# Patient Record
Sex: Male | Born: 1960 | Race: Black or African American | Hispanic: No | Marital: Single | Smoking: Current every day smoker
Health system: Southern US, Community
[De-identification: ages and names within clinical notes are randomized; demographics above are authoritative.]

## PROBLEM LIST (undated history)

## (undated) ENCOUNTER — Ambulatory Visit (HOSPITAL_COMMUNITY): Source: Home / Self Care

## (undated) ENCOUNTER — Emergency Department (HOSPITAL_COMMUNITY): Discharge status: Absent without leave | Payer: PRIVATE HEALTH INSURANCE

## (undated) DIAGNOSIS — I10 Essential (primary) hypertension: Secondary | ICD-10-CM

---

## 1999-05-03 ENCOUNTER — Emergency Department (HOSPITAL_COMMUNITY): Admission: EM | Admit: 1999-05-03 | Discharge: 1999-05-03 | Payer: Self-pay | Admitting: Internal Medicine

## 1999-05-15 ENCOUNTER — Emergency Department (HOSPITAL_COMMUNITY): Admission: EM | Admit: 1999-05-15 | Discharge: 1999-05-15 | Payer: Self-pay | Admitting: Emergency Medicine

## 2002-06-08 ENCOUNTER — Encounter: Admission: RE | Admit: 2002-06-08 | Discharge: 2002-06-08 | Payer: Self-pay | Admitting: Internal Medicine

## 2003-02-25 ENCOUNTER — Emergency Department (HOSPITAL_COMMUNITY): Admission: AD | Admit: 2003-02-25 | Discharge: 2003-02-25 | Payer: Self-pay | Admitting: Emergency Medicine

## 2003-02-25 ENCOUNTER — Encounter: Payer: Self-pay | Admitting: Emergency Medicine

## 2004-07-30 ENCOUNTER — Emergency Department (HOSPITAL_COMMUNITY): Admission: EM | Admit: 2004-07-30 | Discharge: 2004-07-30 | Payer: Self-pay | Admitting: Emergency Medicine

## 2004-08-02 ENCOUNTER — Encounter (HOSPITAL_COMMUNITY): Admission: RE | Admit: 2004-08-02 | Discharge: 2004-10-31 | Payer: Self-pay | Admitting: Emergency Medicine

## 2004-08-06 ENCOUNTER — Emergency Department (HOSPITAL_COMMUNITY): Admission: EM | Admit: 2004-08-06 | Discharge: 2004-08-06 | Payer: Self-pay | Admitting: Emergency Medicine

## 2004-08-13 ENCOUNTER — Emergency Department (HOSPITAL_COMMUNITY): Admission: EM | Admit: 2004-08-13 | Discharge: 2004-08-13 | Payer: Self-pay | Admitting: Emergency Medicine

## 2004-08-27 ENCOUNTER — Emergency Department (HOSPITAL_COMMUNITY): Admission: EM | Admit: 2004-08-27 | Discharge: 2004-08-27 | Payer: Self-pay | Admitting: Emergency Medicine

## 2007-11-09 ENCOUNTER — Emergency Department (HOSPITAL_COMMUNITY): Admission: EM | Admit: 2007-11-09 | Discharge: 2007-11-09 | Payer: Self-pay | Admitting: Emergency Medicine

## 2007-11-17 ENCOUNTER — Emergency Department (HOSPITAL_COMMUNITY): Admission: EM | Admit: 2007-11-17 | Discharge: 2007-11-17 | Payer: Self-pay | Admitting: *Deleted

## 2008-10-21 ENCOUNTER — Emergency Department (HOSPITAL_COMMUNITY): Admission: EM | Admit: 2008-10-21 | Discharge: 2008-10-21 | Payer: Self-pay | Admitting: Emergency Medicine

## 2008-12-21 ENCOUNTER — Emergency Department (HOSPITAL_COMMUNITY): Admission: EM | Admit: 2008-12-21 | Discharge: 2008-12-21 | Payer: Self-pay | Admitting: Family Medicine

## 2009-01-07 ENCOUNTER — Emergency Department (HOSPITAL_COMMUNITY): Admission: EM | Admit: 2009-01-07 | Discharge: 2009-01-07 | Payer: Self-pay | Admitting: Emergency Medicine

## 2011-08-24 LAB — DIFFERENTIAL
Basophils Absolute: 0.1
Eosinophils Absolute: 0.2
Eosinophils Relative: 2
Lymphocytes Relative: 5 — ABNORMAL LOW
Lymphs Abs: 0.5 — ABNORMAL LOW
Neutrophils Relative %: 89 — ABNORMAL HIGH

## 2011-08-24 LAB — CBC
MCV: 87.8
RBC: 5.75
WBC: 9.9

## 2011-08-24 LAB — COMPREHENSIVE METABOLIC PANEL
ALT: 68 — ABNORMAL HIGH
AST: 46 — ABNORMAL HIGH
Alkaline Phosphatase: 122 — ABNORMAL HIGH
CO2: 24
Chloride: 104
Creatinine, Ser: 1.22
GFR calc Af Amer: >60
GFR calc non Af Amer: >60
Potassium: 4.4
Total Bilirubin: 1.6 — ABNORMAL HIGH

## 2011-08-24 LAB — ETHANOL: Alcohol, Ethyl (B): <5

## 2011-08-24 LAB — LIPASE, BLOOD: Lipase: 19

## 2012-12-09 ENCOUNTER — Emergency Department (HOSPITAL_COMMUNITY): Payer: PRIVATE HEALTH INSURANCE

## 2012-12-09 ENCOUNTER — Emergency Department (HOSPITAL_COMMUNITY)
Admission: EM | Admit: 2012-12-09 | Discharge: 2012-12-09 | Disposition: A | Discharge status: Home / Self Care | Payer: PRIVATE HEALTH INSURANCE | Attending: Emergency Medicine | Admitting: Emergency Medicine

## 2012-12-09 DIAGNOSIS — N390 Urinary tract infection, site not specified: Secondary | ICD-10-CM | POA: Insufficient documentation

## 2012-12-09 DIAGNOSIS — A5903 Trichomonal cystitis and urethritis: Secondary | ICD-10-CM | POA: Insufficient documentation

## 2012-12-09 DIAGNOSIS — J029 Acute pharyngitis, unspecified: Secondary | ICD-10-CM | POA: Insufficient documentation

## 2012-12-09 DIAGNOSIS — J3489 Other specified disorders of nose and nasal sinuses: Secondary | ICD-10-CM | POA: Insufficient documentation

## 2012-12-09 DIAGNOSIS — R259 Unspecified abnormal involuntary movements: Secondary | ICD-10-CM | POA: Insufficient documentation

## 2012-12-09 DIAGNOSIS — Z7982 Long term (current) use of aspirin: Secondary | ICD-10-CM | POA: Insufficient documentation

## 2012-12-09 DIAGNOSIS — R000 Tachycardia, unspecified: Secondary | ICD-10-CM | POA: Insufficient documentation

## 2012-12-09 DIAGNOSIS — R059 Cough, unspecified: Secondary | ICD-10-CM | POA: Insufficient documentation

## 2012-12-09 DIAGNOSIS — R05 Cough: Secondary | ICD-10-CM | POA: Insufficient documentation

## 2012-12-09 LAB — URINALYSIS, ROUTINE W REFLEX MICROSCOPIC
Glucose, UA: NEGATIVE mg/dL
Hgb urine dipstick: NEGATIVE
Ketones, ur: NEGATIVE mg/dL
pH: 5 (ref 5.0–8.0)

## 2012-12-09 LAB — COMPREHENSIVE METABOLIC PANEL
ALT: 24 U/L (ref 0–53)
AST: 18 U/L (ref 0–37)
Albumin: 3.6 g/dL (ref 3.5–5.2)
Alkaline Phosphatase: 128 U/L — ABNORMAL HIGH (ref 39–117)
CO2: 24 mEq/L (ref 19–32)
Chloride: 100 mEq/L (ref 96–112)
GFR calc non Af Amer: 70 mL/min — ABNORMAL LOW (ref >90)
Potassium: 3.8 mEq/L (ref 3.5–5.1)
Sodium: 136 mEq/L (ref 135–145)
Total Bilirubin: 1 mg/dL (ref 0.3–1.2)

## 2012-12-09 LAB — CBC WITH DIFFERENTIAL/PLATELET
Basophils Absolute: 0 10*3/uL (ref 0.0–0.1)
Basophils Relative: 0 % (ref 0–1)
Lymphocytes Relative: 5 % — ABNORMAL LOW (ref 12–46)
MCHC: 35.2 g/dL (ref 30.0–36.0)
Neutro Abs: 9.3 10*3/uL — ABNORMAL HIGH (ref 1.7–7.7)
Neutrophils Relative %: 91 % — ABNORMAL HIGH (ref 43–77)
RDW: 12.1 % (ref 11.5–15.5)
WBC: 10.2 10*3/uL (ref 4.0–10.5)

## 2012-12-09 LAB — URINE MICROSCOPIC-ADD ON

## 2012-12-09 MED ORDER — ONDANSETRON HCL 4 MG/2ML IJ SOLN
4.0000 mg | Freq: Once | INTRAMUSCULAR | Status: AC
  Administered 2012-12-09: 4 mg via INTRAVENOUS
  Filled 2012-12-09: qty 2

## 2012-12-09 MED ORDER — ACETAMINOPHEN 325 MG PO TABS
650.0000 mg | ORAL_TABLET | Freq: Once | ORAL | Status: AC
  Administered 2012-12-09: 650 mg via ORAL
  Filled 2012-12-09: qty 2

## 2012-12-09 MED ORDER — IBUPROFEN 800 MG PO TABS
800.0000 mg | ORAL_TABLET | Freq: Once | ORAL | Status: AC
  Administered 2012-12-09: 800 mg via ORAL
  Filled 2012-12-09: qty 1

## 2012-12-09 MED ORDER — CEFTRIAXONE SODIUM 1 G IJ SOLR
1.0000 g | Freq: Once | INTRAMUSCULAR | Status: AC
  Administered 2012-12-09: 1 g via INTRAMUSCULAR
  Filled 2012-12-09: qty 10

## 2012-12-09 MED ORDER — CEPHALEXIN 500 MG PO CAPS: 1000.0000 mg | ORAL_CAPSULE | Freq: Two times a day (BID) | ORAL | Status: DC

## 2012-12-09 MED ORDER — METRONIDAZOLE 500 MG PO TABS
2000.0000 mg | ORAL_TABLET | Freq: Once | ORAL | Status: AC
  Administered 2012-12-09: 2000 mg via ORAL
  Filled 2012-12-09: qty 4

## 2012-12-09 NOTE — ED Notes (Signed)
Informed Catherine of temp and diaphoresis. Walked pt to restroom for urine sample. Pt ambulated without difficulty.

## 2012-12-09 NOTE — ED Notes (Addendum)
Pt has had a sore throat for a wk and tonight at work he starting shaking. States he has had a productive cough, sneezing, denies n/v/d. HR 150. BG 121

## 2012-12-09 NOTE — ED Notes (Signed)
ZOX:[id number]<AV> Expected date:<BR> Expected time:<BR> Means of arrival:<BR> Comments:<BR> EMS/52 yo male from work/chills/fever-ST 150-12 lead unremarkable

## 2012-12-09 NOTE — ED Provider Notes (Signed)
History     CSN: [id number]  Arrival date & time 12/09/12  0118   None     Chief Complaint  Patient presents with  . Tachycardia  . Sore Throat    (Consider location/radiation/quality/duration/timing/severity/associated sxs/prior treatment) HPI History provided by pt.   Pt reports that he developed severe chills at noon while at work today.  Associated w/ sneezing.  No recent nasal congestion, rhinorrhea, sore throat, cough, CP, SOB, abd pain, N/V/D, urinary sx or rash.  Had tylenol when he arrived in ED and sx have improved.  No known sick contacts.  No PMH.  No past medical history on file.  No past surgical history on file.  No family history on file.  History  Substance Use Topics  . Smoking status: Not on file  . Smokeless tobacco: Not on file  . Alcohol Use: Not on file      Review of Systems  All other systems reviewed and are negative.    Allergies  Review of patient's allergies indicates no known allergies.  Home Medications  No current outpatient prescriptions on file.  BP 129/74  Pulse 134  Temp 103 F (39.4 C) (Oral)  Resp 22  SpO2 94%  Physical Exam  Nursing note and vitals reviewed. Constitutional: He is oriented to person, place, and time. He appears well-developed and well-nourished. No distress.  HENT:  Head: Normocephalic and atraumatic.  Eyes:       Normal appearance  Neck: Normal range of motion.  Cardiovascular: Regular rhythm.        Tachycardic at 112bpm  Pulmonary/Chest: Effort normal and breath sounds normal. No respiratory distress.  Abdominal: Soft. Bowel sounds are normal. He exhibits no distension and no mass. There is no tenderness. There is no rebound and no guarding.  Genitourinary:       No CVA tenderness  Musculoskeletal: Normal range of motion.       No LE edema/ttp  Neurological: He is alert and oriented to person, place, and time.  Skin: Skin is warm and dry. No rash noted.  Psychiatric: He has a normal mood and  affect. His behavior is normal.    ED Course  Procedures (including critical care time)   Labs Reviewed  CBC WITH DIFFERENTIAL  COMPREHENSIVE METABOLIC PANEL  TROPONIN I   No results found.   1. Urinary tract infection   2. Trichomonal urethritis       MDM  52yo healthy M presents w/ fever and chills.  Febrile, non-toxic appearance, nml breath sounds, abd benign on exam.  Temp improved and pt reported feeling better after receiving tylenol, but now up to 100.2.  Ibuprofen 800mg  ordered.  CXR neg and U/A pending.  3:45 AM   CXR negative for acute pathology.  U/A positive for infection, including trich.  Results discussed w/ pt.  He is tolerating pos and is currently afebrile and with nml VS.  Received 1g IM rocephin + 2g pos flagl and d/c'd home w/ keflex.  Return precautions discussed.         Arie Sabina Dexter Sauser, PA-C 12/09/12 [phone]

## 2012-12-09 NOTE — ED Notes (Signed)
Voiced understanding of instructions given 

## 2012-12-11 LAB — URINE CULTURE

## 2012-12-14 NOTE — ED Provider Notes (Signed)
Medical screening examination/treatment/procedure(s) were performed by non-physician practitioner and as supervising physician I was immediately available for consultation/collaboration.  Tobin Chad, MD 12/14/12 [phone]

## 2014-01-30 ENCOUNTER — Emergency Department (HOSPITAL_COMMUNITY)
Admission: EM | Admit: 2014-01-30 | Discharge: 2014-01-30 | Disposition: A | Discharge status: Home / Self Care | Payer: Non-veteran care | Attending: Emergency Medicine | Admitting: Emergency Medicine

## 2014-01-30 ENCOUNTER — Encounter (HOSPITAL_COMMUNITY): Payer: Self-pay | Admitting: Emergency Medicine

## 2014-01-30 ENCOUNTER — Emergency Department (HOSPITAL_COMMUNITY): Payer: Non-veteran care

## 2014-01-30 DIAGNOSIS — M25469 Effusion, unspecified knee: Secondary | ICD-10-CM | POA: Diagnosis not present

## 2014-01-30 DIAGNOSIS — Z792 Long term (current) use of antibiotics: Secondary | ICD-10-CM | POA: Insufficient documentation

## 2014-01-30 DIAGNOSIS — M25569 Pain in unspecified knee: Secondary | ICD-10-CM | POA: Diagnosis present

## 2014-01-30 DIAGNOSIS — M25461 Effusion, right knee: Secondary | ICD-10-CM

## 2014-01-30 DIAGNOSIS — Z79899 Other long term (current) drug therapy: Secondary | ICD-10-CM | POA: Insufficient documentation

## 2014-01-30 DIAGNOSIS — Z7982 Long term (current) use of aspirin: Secondary | ICD-10-CM | POA: Diagnosis not present

## 2014-01-30 LAB — CBG MONITORING, ED: GLUCOSE-CAPILLARY: 101 mg/dL — AB (ref 70–99)

## 2014-01-30 LAB — GRAM STAIN

## 2014-01-30 LAB — SYNOVIAL CELL COUNT + DIFF, W/ CRYSTALS
CRYSTALS FLUID: NONE SEEN
EOSINOPHILS-SYNOVIAL: 0 % (ref 0–1)
LYMPHOCYTES-SYNOVIAL FLD: 30 % — AB (ref 0–20)
MONOCYTE-MACROPHAGE-SYNOVIAL FLUID: 56 % (ref 50–90)
Neutrophil, Synovial: 14 % (ref 0–25)
WBC, Synovial: 525 /mm3 — ABNORMAL HIGH (ref 0–200)

## 2014-01-30 MED ORDER — NAPROXEN 500 MG PO TABS: 500.0000 mg | ORAL_TABLET | Freq: Two times a day (BID) | ORAL | Status: DC

## 2014-01-30 NOTE — Discharge Instructions (Signed)
Naproxen and naproxen sodium oral immediate-release tablets What is this medicine? NAPROXEN (na PROX en) is a non-steroidal anti-inflammatory drug (NSAID). It is used to reduce swelling and to treat pain. This medicine may be used for dental pain, headache, or painful monthly periods. It is also used for painful joint and muscular problems such as arthritis, tendinitis, bursitis, and gout. This medicine may be used for other purposes; ask your health care provider or pharmacist if you have questions. COMMON BRAND NAME(S): Aflaxen, Aleve Arthritis, Aleve, All Day Relief, Anaprox DS, Anaprox, Naprosyn What should I tell my health care provider before I take this medicine? They need to know if you have any of these conditions: -asthma -cigarette smoker -drink more than 3 alcohol containing drinks a day -heart disease or circulation problems such as heart failure or leg edema (fluid retention) -high blood pressure -kidney disease -liver disease -stomach bleeding or ulcers -an unusual or allergic reaction to naproxen, aspirin, other NSAIDs, other medicines, foods, dyes, or preservatives -pregnant or trying to get pregnant -breast-feeding How should I use this medicine? Take this medicine by mouth with a glass of water. Follow the directions on the prescription label. Take it with food if your stomach gets upset. Try to not lie down for at least 10 minutes after you take it. Take your medicine at regular intervals. Do not take your medicine more often than directed. Long-term, continuous use may increase the risk of heart attack or stroke. A special MedGuide will be given to you by the pharmacist with each prescription and refill. Be sure to read this information carefully each time. Talk to your pediatrician regarding the use of this medicine in children. Special care may be needed. Overdosage: If you think you have taken too much of this medicine contact a poison control center or emergency room at  once. NOTE: This medicine is only for you. Do not share this medicine with others. What if I miss a dose? If you miss a dose, take it as soon as you can. If it is almost time for your next dose, take only that dose. Do not take double or extra doses. What may interact with this medicine? -alcohol -aspirin -cidofovir -diuretics -lithium -methotrexate -other drugs for inflammation like ketorolac or prednisone -pemetrexed -probenecid -warfarin This list may not describe all possible interactions. Give your health care provider a list of all the medicines, herbs, non-prescription drugs, or dietary supplements you use. Also tell them if you smoke, drink alcohol, or use illegal drugs. Some items may interact with your medicine. What should I watch for while using this medicine? Tell your doctor or health care professional if your pain does not get better. Talk to your doctor before taking another medicine for pain. Do not treat yourself. This medicine does not prevent heart attack or stroke. In fact, this medicine may increase the chance of a heart attack or stroke. The chance may increase with longer use of this medicine and in people who have heart disease. If you take aspirin to prevent heart attack or stroke, talk with your doctor or health care professional. Do not take other medicines that contain aspirin, ibuprofen, or naproxen with this medicine. Side effects such as stomach upset, nausea, or ulcers may be more likely to occur. Many medicines available without a prescription should not be taken with this medicine. This medicine can cause ulcers and bleeding in the stomach and intestines at any time during treatment. Do not smoke cigarettes or drink alcohol. These increase  irritation to your stomach and can make it more susceptible to damage from this medicine. Ulcers and bleeding can happen without warning symptoms and can cause death. You may get drowsy or dizzy. Do not drive, use machinery,  or do anything that needs mental alertness until you know how this medicine affects you. Do not stand or sit up quickly, especially if you are an older patient. This reduces the risk of dizzy or fainting spells. This medicine can cause you to bleed more easily. Try to avoid damage to your teeth and gums when you brush or floss your teeth. What side effects may I notice from receiving this medicine? Side effects that you should report to your doctor or health care professional as soon as possible: -black or bloody stools, blood in the urine or vomit -blurred vision -chest pain -difficulty breathing or wheezing -nausea or vomiting -severe stomach pain -skin rash, skin redness, blistering or peeling skin, hives, or itching -slurred speech or weakness on one side of the body -swelling of eyelids, throat, lips -unexplained weight gain or swelling -unusually weak or tired -yellowing of eyes or skin Side effects that usually do not require medical attention (report to your doctor or health care professional if they continue or are bothersome): -constipation -headache -heartburn This list may not describe all possible side effects. Call your doctor for medical advice about side effects. You may report side effects to FDA at 1-800-FDA-1088. Where should I keep my medicine? Keep out of the reach of children. Store at room temperature between 15 and 30 degrees C (59 and 86 degrees F). Keep container tightly closed. Throw away any unused medicine after the expiration date. NOTE: This sheet is a summary. It may not cover all possible information. If you have questions about this medicine, talk to your doctor, pharmacist, or health care provider.  2014, Elsevier/Gold Standard. (2009-11-07 20:10:16) Knee Effusion The medical term for having fluid in your knee is effusion. This is often due to an internal derangement of the knee. This means something is wrong inside the knee. Some of the causes of fluid  in the knee may be torn cartilage, a torn ligament, or bleeding into the joint from an injury. Your knee is likely more difficult to bend and move. This is often because there is increased pain and pressure in the joint. The time it takes for recovery from a knee effusion depends on different factors, including:   Type of injury.  Your age.  Physical and medical conditions.  Rehabilitation Strategies. How long you will be away from your normal activities will depend on what kind of knee problem you have and how much damage is present. Your knee has two types of cartilage. Articular cartilage covers the bone ends and lets your knee bend and move smoothly. Two menisci, thick pads of cartilage that form a rim inside the joint, help absorb shock and stabilize your knee. Ligaments bind the bones together and support your knee joint. Muscles move the joint, help support your knee, and take stress off the joint itself. CAUSES  Often an effusion in the knee is caused by an injury to one of the menisci. This is often a tear in the cartilage. Recovery after a meniscus injury depends on how much meniscus is damaged and whether you have damaged other knee tissue. Small tears may heal on their own with conservative treatment. Conservative means rest, limited weight bearing activity and muscle strengthening exercises. Your recovery may take up to 6 weeks.  TREATMENT  Larger tears may require surgery. Meniscus injuries may be treated during arthroscopy. Arthroscopy is a procedure in which your surgeon uses a small telescope like instrument to look in your knee. Your caregiver can make a more accurate diagnosis (learning what is wrong) by performing an arthroscopic procedure. If your injury is on the inner margin of the meniscus, your surgeon may trim the meniscus back to a smooth rim. In other cases your surgeon will try to repair a damaged meniscus with stitches (sutures). This may make rehabilitation take longer,  but may provide better long term result by helping your knee keep its shock absorption capabilities. Ligaments which are completely torn usually require surgery for repair. HOME CARE INSTRUCTIONS  Use crutches as instructed.  If a brace is applied, use as directed.  Once you are home, an ice pack applied to your swollen knee may help with discomfort and help decrease swelling.  Keep your knee raised (elevated) when you are not up and around or on crutches.  Only take over-the-counter or prescription medicines for pain, discomfort, or fever as directed by your caregiver.  Your caregivers will help with instructions for rehabilitation of your knee. This often includes strengthening exercises.  You may resume a normal diet and activities as directed. SEEK MEDICAL CARE IF:   There is increased swelling in your knee.  You notice redness, swelling, or increasing pain in your knee.  An unexplained oral temperature above 102 F (38.9 C) develops. SEEK IMMEDIATE MEDICAL CARE IF:   You develop a rash.  You have difficulty breathing.  You have any allergic reactions from medications you may have been given.  There is severe pain with any motion of the knee. MAKE SURE YOU:   Understand these instructions.  Will watch your condition.  Will get help right away if you are not doing well or get worse. Document Released: 01/26/2004 Document Revised: 01/28/2012 Document Reviewed: 03/31/2008 [facility] Patient Information 2014 [place], [place].

## 2014-01-30 NOTE — ED Notes (Signed)
Pt from home c/o right knee pain and swelling in which he woke up with about 1 week ago. Denies injury of any sort. Family Hx of gout.

## 2014-01-30 NOTE — ED Provider Notes (Signed)
CSN: [id number]     Arrival date & time 01/30/14  1247 History   First MD Initiated Contact with Patient 01/30/14 1312     This chart was scribed for non-physician practitioner, Arthor Captain, PA-C, working with Gavin Pound. Oletta Lamas, MD by Arlan Organ, ED Scribe. This patient was seen in room WTR6/WTR6 and the patient's care was started at 1:13 PM.   Chief Complaint  Patient presents with  . Knee Pain   HPI  HPI Comments: Derrick Foster is a 53 y.o. male who presents to the Emergency Department complaining of right knee pain x 6 days that is progressively worsening. Pt also reports associated swelling to the knee along with "popping", feeling unstable, and "clicking". Denies any known injury or trauma, however, he associates this with job related activities . States he is able to ambulate, but with difficulty secondary to pain. He admits to a family history of gout, but denies a personal history. At this time he denies any fever or chills. Pt has no other pertinent medical history, and no other concerns this visit.    No past medical history on file. No past surgical history on file. No family history on file. History  Substance Use Topics  . Smoking status: Not on file  . Smokeless tobacco: Not on file  . Alcohol Use: Not on file    Review of Systems  Constitutional: Negative for fever and chills.  Eyes: Negative for redness.  Respiratory: Negative for cough.   Musculoskeletal: Positive for arthralgias and joint swelling.  Skin: Negative for rash.  Psychiatric/Behavioral: Negative for confusion.      Allergies  Review of patient's allergies indicates no known allergies.  Home Medications   Current Outpatient Rx  Name  Route  Sig  Dispense  Refill  . aspirin EC 81 MG tablet   Oral   Take 81 mg by mouth daily.         . cephALEXin (KEFLEX) 500 MG capsule   Oral   Take 2 capsules (1,000 mg total) by mouth 2 (two) times daily.   28 capsule   0   . Multiple Vitamin  (MULTIVITAMIN WITH MINERALS) TABS   Oral   Take 1 tablet by mouth daily.         Marland Kitchen Phenyleph-Doxylamine-DM-APAP (ALKA-SELTZER PLS NIGHT CLD/FLU PO)   Oral   Take 1 packet by mouth 2 (two) times daily as needed. For cold & flu symptoms         . Probiotic Product (PROBIOTIC DAILY PO)   Oral   Take 1 capsule by mouth daily.          . Pseudoeph-Doxylamine-DM-APAP (NYQUIL D COLD/FLU PO)   Oral   Take 15 mLs by mouth 2 (two) times daily as needed. For cold & flu symptoms          Triage Vitals: BP 153/88  Pulse 76  Temp(Src) 98.2 F (36.8 C) (Oral)  Resp 18  Ht 6' (1.829 m)  Wt 260 lb (117.935 kg)  BMI 35.25 kg/m2  SpO2 99%   Physical Exam  Nursing note and vitals reviewed. Constitutional: He is oriented to person, place, and time. He appears well-developed and well-nourished.  HENT:  Head: Normocephalic and atraumatic.  Eyes: EOM are normal.  Neck: Normal range of motion.  Cardiovascular: Normal rate.   Pulmonary/Chest: Effort normal.  Musculoskeletal: Normal range of motion. He exhibits edema and tenderness.  RIGHT KNEE: Palpable effusion in the knee suprapatellar tenderness  No erythema  Swelling noted No warmth No crepitus with movement of knee ROM is limited due to swelling and tenderness  Neurological: He is alert and oriented to person, place, and time.  Skin: Skin is warm and dry.  Psychiatric: He has a normal mood and affect. His behavior is normal.    ED Course  Procedures (including critical care time)  DIAGNOSTIC STUDIES: Oxygen Saturation is 99% on RA, Normal by my interpretation.    COORDINATION OF CARE: 1:13 PM- Will order knee complete 4 view X-Ray. Will perform knee arthrocentesis procedure. Discussed treatment plan with pt at bedside and pt agreed to plan.    2:48 PM-  Knee Arthrocentesis Procedure Note Date:09/06/2011  At 2:49 PM Indication: Suprapatellar Effusion Procedure performed by: Arthor CaptainAbigail Darling Cieslewicz, PA-C Site: Right  knee Technique: Supralateral Sterile procedures observed Time-out performed prior to start of procedure and correct patient, site and procedure verified.  Anesthetic used (type and amount): lidocaine without 1%  Fluid aspirated (amount): 40 ml Appearance of Fluid: Yellow  Patient's knee was placed into an appropriate position prior to introduction of needle. Patient tolerated procedure well with no complications. Blood loss was minimal.    Labs Review Labs Reviewed - No data to display Imaging Review No results found.   EKG Interpretation None      MDM   Final diagnoses:  Knee effusion, right    Patient with arthrocentesis. Fluid sent for analysis. No organisms and negative gram stain. Ace wrap and follow up with orhto. No concern for gout or septic joint   I personally performed the services described in this documentation, which was scribed in my presence. The recorded information has been reviewed and is accurate.       Arthor Captainbigail Daylani Deblois, PA-C 01/30/14 1936

## 2014-01-31 LAB — GLUCOSE, SYNOVIAL FLUID: GLUCOSE, SYNOVIAL FLUID: 101 mg/dL

## 2014-01-31 NOTE — ED Provider Notes (Signed)
Medical screening examination/treatment/procedure(s) were performed by non-physician practitioner and as supervising physician I was immediately available for consultation/collaboration.   EKG Interpretation None        Oney Tatlock Y. Fatoumata Albaugh, MD 01/31/14 0815 

## 2014-02-03 LAB — BODY FLUID CULTURE: Culture: NO GROWTH

## 2015-03-10 IMAGING — CR DG KNEE COMPLETE 4+V*R*
4 series · 4 of 4 positions shown · non-contrast
Comparison: None.

CLINICAL DATA: Right knee pain and swelling

EXAM:
RIGHT KNEE - COMPLETE 4+ VIEW

[t knee ap right]
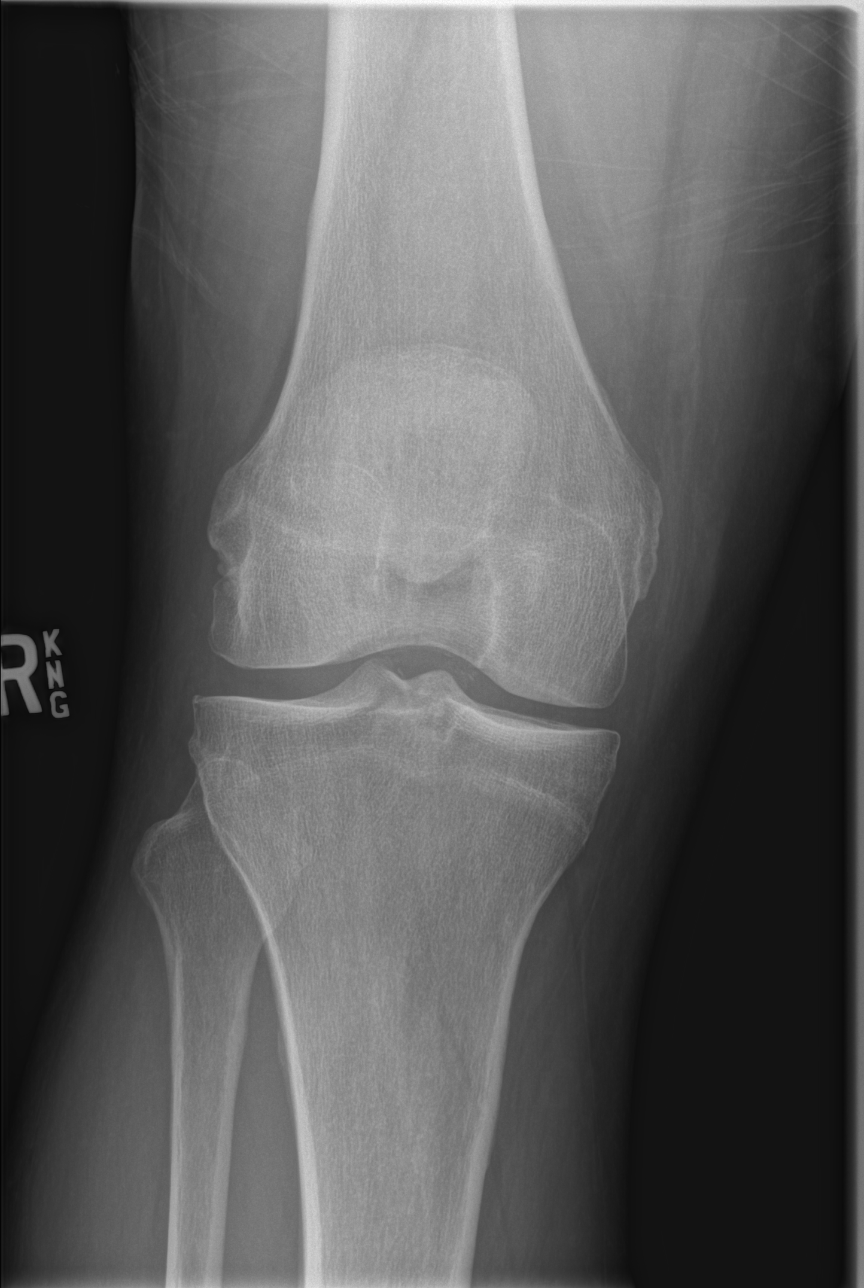

[t knee obl right (1 of 2)]
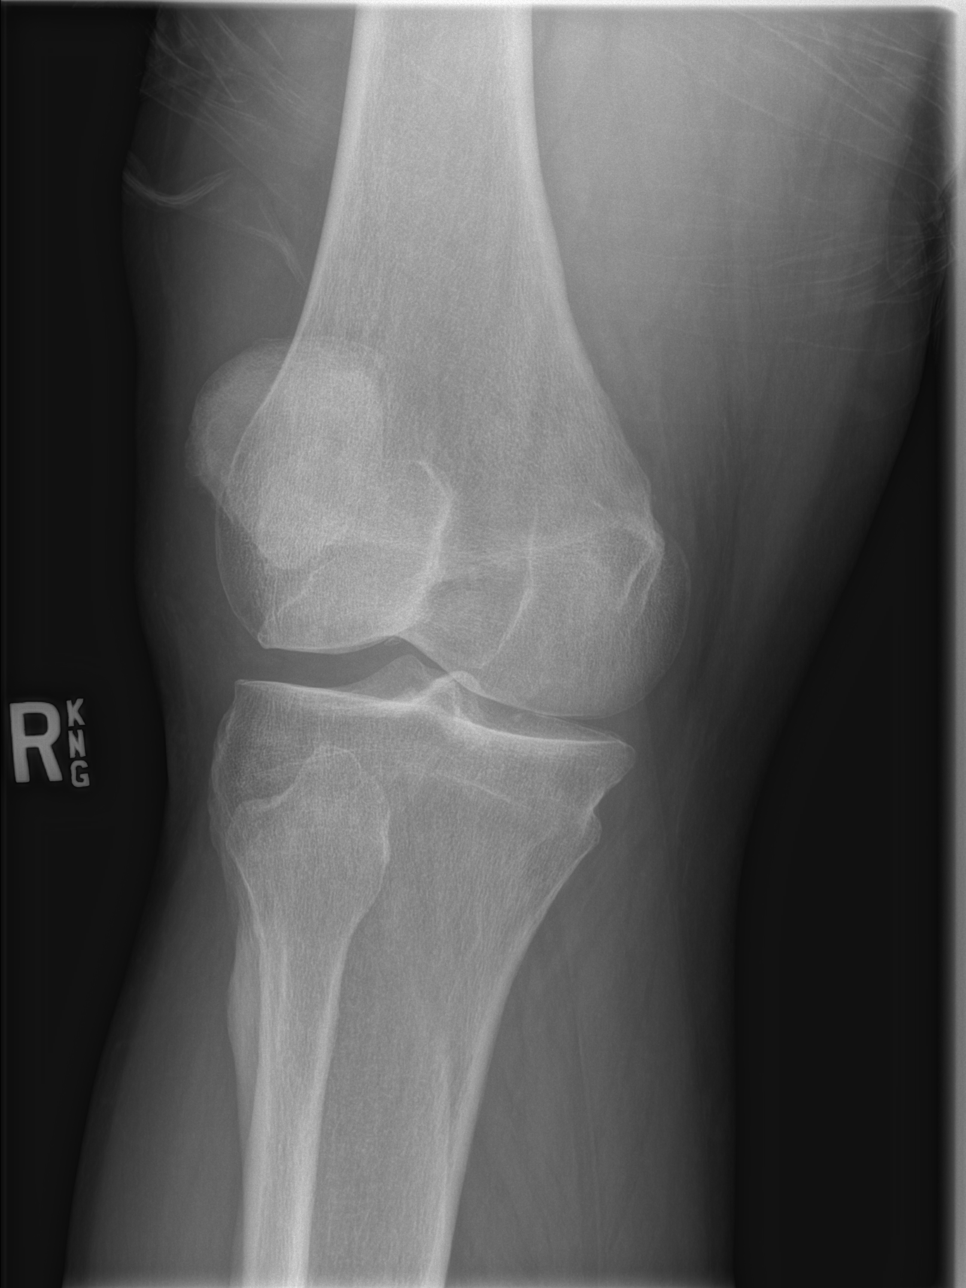

[t knee obl right (2 of 2)]
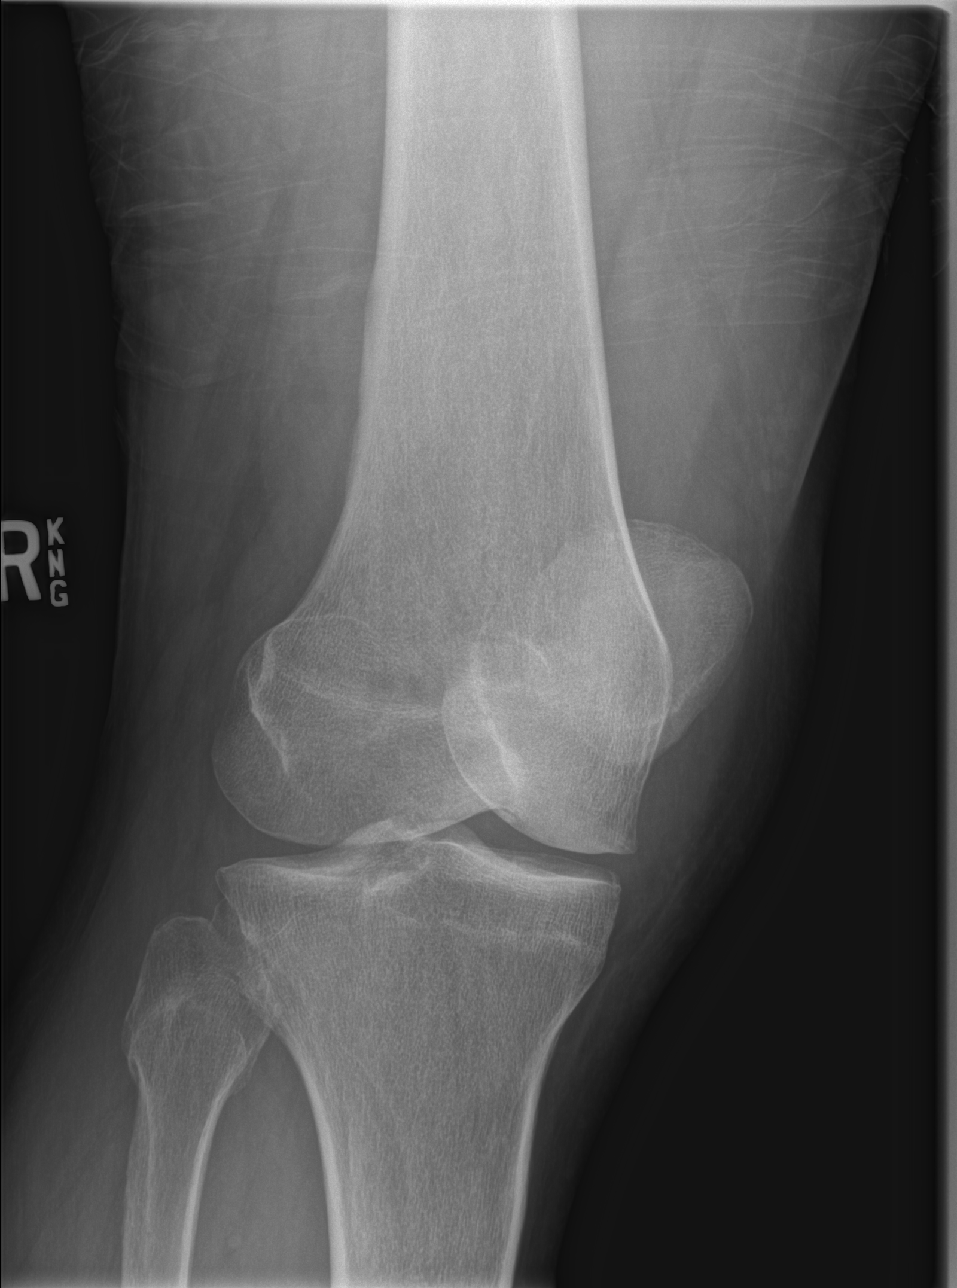

[t knee lat right]
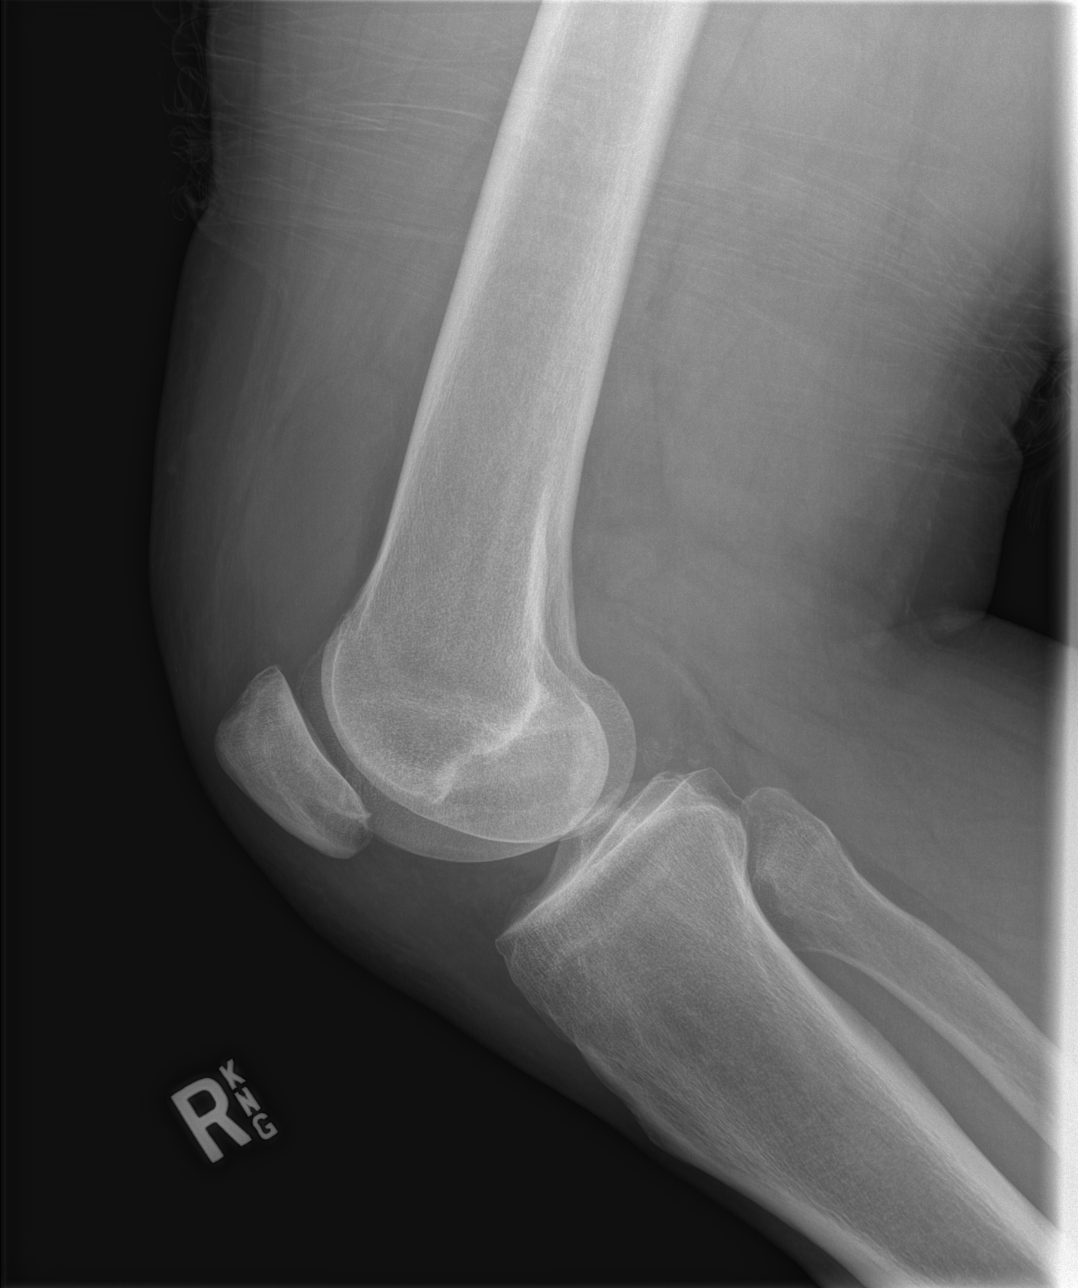

[4 of 4 positions shown; findings below may reference images not displayed]

FINDINGS: No acute fracture or malalignment. There is a moderate suprapatellar
joint effusion. Faint calcification noted in the posterior and
central joint on both the frontal and lateral view. This may
represent a partially calcified intra-articular loose body, or
partially calcified synovial hyperplasia. Normal bony
mineralization. No lytic or blastic osseous lesion.
IMPRESSION: 1. Moderate suprapatellar joint effusion. Differential
considerations include degenerative, infectious, and inflammatory
etiologies. Spontaneous hemarthrosis is also a less likely
consideration.
2. Subtle density with faint linear calcifications in the posterior
and central aspect of the joint. This may represent a partially
calcified intra-articular loose body. If further imaging is
clinically warranted, MRI of the knee could further evaluate.

## 2015-10-15 ENCOUNTER — Emergency Department (HOSPITAL_COMMUNITY): Payer: Non-veteran care

## 2015-10-15 ENCOUNTER — Encounter (HOSPITAL_COMMUNITY): Payer: Self-pay | Admitting: Nurse Practitioner

## 2015-10-15 ENCOUNTER — Emergency Department (HOSPITAL_COMMUNITY)
Admission: EM | Admit: 2015-10-15 | Discharge: 2015-10-15 | Disposition: A | Discharge status: Home / Self Care | Payer: Non-veteran care | Attending: Emergency Medicine | Admitting: Emergency Medicine

## 2015-10-15 DIAGNOSIS — F17210 Nicotine dependence, cigarettes, uncomplicated: Secondary | ICD-10-CM | POA: Insufficient documentation

## 2015-10-15 DIAGNOSIS — Y990 Civilian activity done for income or pay: Secondary | ICD-10-CM | POA: Insufficient documentation

## 2015-10-15 DIAGNOSIS — Z7982 Long term (current) use of aspirin: Secondary | ICD-10-CM | POA: Insufficient documentation

## 2015-10-15 DIAGNOSIS — Y9389 Activity, other specified: Secondary | ICD-10-CM | POA: Diagnosis not present

## 2015-10-15 DIAGNOSIS — M79644 Pain in right finger(s): Secondary | ICD-10-CM

## 2015-10-15 DIAGNOSIS — Y9289 Other specified places as the place of occurrence of the external cause: Secondary | ICD-10-CM | POA: Diagnosis not present

## 2015-10-15 DIAGNOSIS — Z79899 Other long term (current) drug therapy: Secondary | ICD-10-CM | POA: Insufficient documentation

## 2015-10-15 DIAGNOSIS — S6991XA Unspecified injury of right wrist, hand and finger(s), initial encounter: Secondary | ICD-10-CM | POA: Diagnosis present

## 2015-10-15 DIAGNOSIS — I10 Essential (primary) hypertension: Secondary | ICD-10-CM | POA: Insufficient documentation

## 2015-10-15 DIAGNOSIS — X58XXXA Exposure to other specified factors, initial encounter: Secondary | ICD-10-CM | POA: Diagnosis not present

## 2015-10-15 HISTORY — DX: Essential (primary) hypertension: I10

## 2015-10-15 MED ORDER — IBUPROFEN 400 MG PO TABS
800.0000 mg | ORAL_TABLET | Freq: Once | ORAL | Status: AC
  Administered 2015-10-15: 800 mg via ORAL
  Filled 2015-10-15: qty 2

## 2015-10-15 MED ORDER — IBUPROFEN 400 MG PO TABS: 400.0000 mg | ORAL_TABLET | Freq: Four times a day (QID) | ORAL | Status: DC | PRN

## 2015-10-15 NOTE — ED Notes (Signed)
Pt is in stable condition upon d/c and ambulates from ED. 

## 2015-10-15 NOTE — Discharge Instructions (Signed)
There is not appear to be an emergent cause for your finger pain at this time. Your x-rays did not show any broken bones or other concerning findings. Please take her Motrin as needed for your discomfort. If your pain does not improve in one week, you may also follow-up with orthopedics, Dr. Janee Mornhompson, for further evaluation and management of your pain. Return to ED for any new or worsening symptoms.

## 2015-10-15 NOTE — ED Provider Notes (Signed)
CSN: [id number]     Arrival date & time 10/15/15  1256 History  By signing my name below, I, Soijett Blue, attest that this documentation has been prepared under the direction and in the presence of Mayme GentaBen Meah Jiron, [employer] CorporationPA-C Electronically Signed: Soijett Blue, ED Scribe. 10/15/2015. 2:04 PM.   Chief Complaint  Patient presents with  . Hand Pain      The history is provided by the patient. No language interpreter was used.    Derrick Foster is a 54 y.o. male with a medical hx of HTN who presents to the Emergency Department complaining of 7/10, constant, tight discomfort/sharp, right middle finger pain/swelling onset yesterday. 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 Pt reports that while trying to bend his hand he felt a pop to his right middle finger. He notes that he has not tried any medications for the relief of his symptoms. He denies color change, wound, rash, and any other symptoms. Denies PMHx of gout or DM. Denies kidney or stomach issues at this time.   Past Medical History  Diagnosis Date  . Hypertension    History reviewed. No pertinent past surgical history. History reviewed. No pertinent family history. Social History  Substance Use Topics  . Smoking status: Current Every Day Smoker -- 0.50 packs/day    Types: Cigarettes  . Smokeless tobacco: Current User  . Alcohol Use: 0.6 oz/week    1 Shots of liquor per week     Comment: every day    Review of Systems  Musculoskeletal: Positive for joint swelling and arthralgias.  Skin: Negative for color change, rash and wound.      Allergies  Review of patient's allergies indicates no known allergies.  Home Medications   Prior to Admission medications   Medication Sig Start Date End Date Taking? Authorizing Provider  aspirin EC 81 MG tablet Take 81 mg by mouth daily.    Historical Provider, MD  ibuprofen (ADVIL,MOTRIN) 400 MG tablet Take 1 tablet (400 mg total) by  mouth every 6 (six) hours as needed. 10/15/15   Joycie PeekBenjamin Birgitta Uhlir, PA-C  Multiple Vitamin (MULTIVITAMIN WITH MINERALS) TABS Take 1 tablet by mouth daily.    Historical Provider, MD  naproxen (NAPROSYN) 500 MG tablet Take 1 tablet (500 mg total) by mouth 2 (two) times daily with a meal. 01/30/14   Arthor CaptainAbigail Harris, PA-C  naproxen sodium (ANAPROX) 220 MG tablet Take 440 mg by mouth 2 (two) times daily as needed (pain).    Historical Provider, MD  Probiotic Product (PROBIOTIC DAILY PO) Take 1 capsule by mouth daily.     Historical Provider, MD  traMADol (ULTRAM) 50 MG tablet Take 50 mg by mouth once.    Historical Provider, MD   BP 127/81 mmHg  Pulse 84  Temp(Src) 98.3 F (36.8 C) (Oral)  Resp 18  Ht 6' (1.829 m)  Wt 115.622 kg  BMI 34.56 kg/m2  SpO2 95% Physical Exam  Constitutional: He is oriented to person, place, and time. He appears well-developed and well-nourished. No distress.  HENT:  Head: Normocephalic and atraumatic.  Eyes: EOM are normal.  Neck: Neck supple.  Cardiovascular: Normal rate, regular rhythm and normal heart sounds.  Exam reveals no gallop and no friction rub.   No murmur heard. Pulmonary/Chest: Effort normal and breath sounds normal. No respiratory distress. He has no wheezes. He has no rales.  Abdominal: Soft. There is no tenderness.  Musculoskeletal: Normal  range of motion.       Right hand: He exhibits tenderness. He exhibits normal range of motion.  Tenderness over 3rd MCP right hand. Some intermittent catching with flexion and extension at the MCP. Compartments soft. No erythema. No overt warmth. Full ROM but this exacerbates the pain. Pulses intact.   Neurological: He is alert and oriented to person, place, and time.  Skin: Skin is warm and dry. He is not diaphoretic.  Psychiatric: He has a normal mood and affect. His behavior is normal.  Nursing note and vitals reviewed.   ED Course  Procedures (including critical care time) DIAGNOSTIC STUDIES: Oxygen  Saturation is 96% on RA, nl by my interpretation.    COORDINATION OF CARE: 2:03 PM Discussed treatment plan with pt at bedside which includes ibuprofen and right hand xray and pt agreed to plan.    Labs Review Labs Reviewed - No data to display  Imaging Review Dg Hand Complete Right  10/15/2015  CLINICAL DATA:  Pain and swelling at 3rd MCP joint of right hand since yesterday AM. No known injury. No hx of injuries or surgeries to the right hand. EXAM: RIGHT HAND - COMPLETE 3+ VIEW COMPARISON:  None. FINDINGS: Mild osteophyte formation second and third MCP joints. Mild arthritic change first carpometacarpal joint. No fracture or dislocation. IMPRESSION: Mild degenerative change with no acute findings Electronically Signed   By: Esperanza Heir M.D.   On: 10/15/2015 14:43   I have personally reviewed and evaluated these images as part of my medical decision-making   EKG Interpretation None      Medications  ibuprofen (ADVIL,MOTRIN) tablet 800 mg (not administered)    Filed Vitals:   10/15/15 1306 10/15/15 1440  BP: 142/94 127/81  Pulse: 83 84  Temp: 98.2 F (36.8 C) 98.3 F (36.8 C)  Resp:  18  ,   MDM  Derrick Foster is a 54 y.o. male comes in for evaluation of right third finger pain. Patient does have intermittent catching at the MCP. Neurovascularly intact. No erythema or swelling. Low suspicion for infection. Plain films show no acute osseous abnormalities. Suspect possible A1 pulley abnormality. Patient discharged with anti-inflammatories. If symptoms do not improve over the next week, follow-up with orthopedics, referral given. Patient verbalizes understanding and agrees with this plan. Voices no other questions or concerns at this time. Overall he appears well, nontoxic, hemodynamically stable and afebrile and is appropriate for discharge. Final diagnoses:  Finger pain, right    I personally performed the services described in this documentation, which was scribed in my  presence. The recorded information has been reviewed and is accurate.    Joycie Peek, PA-C 10/15/15 1459  Derrick Loveless, MD 10/16/15 [phone]

## 2015-10-15 NOTE — ED Notes (Signed)
Pt woke yesterday with R middle finger pain and swelling, tender to palp at knuckle. Denies any injuries, denies hx gout, denies similar pain/swelling in other joints. He has tried nothing at home for pain. He is concerned because he uses hands at work and has to return to work Monday so decided to return for evaluation.

## 2015-10-15 NOTE — ED Notes (Signed)
Patient transported to X-ray 

## 2015-11-02 ENCOUNTER — Encounter (HOSPITAL_COMMUNITY): Payer: Self-pay | Admitting: Emergency Medicine

## 2015-11-02 ENCOUNTER — Emergency Department (HOSPITAL_COMMUNITY)
Admission: EM | Admit: 2015-11-02 | Discharge: 2015-11-02 | Disposition: A | Discharge status: Home / Self Care | Payer: PRIVATE HEALTH INSURANCE | Attending: Physician Assistant | Admitting: Physician Assistant

## 2015-11-02 DIAGNOSIS — S4992XA Unspecified injury of left shoulder and upper arm, initial encounter: Secondary | ICD-10-CM | POA: Diagnosis present

## 2015-11-02 DIAGNOSIS — Y998 Other external cause status: Secondary | ICD-10-CM | POA: Diagnosis not present

## 2015-11-02 DIAGNOSIS — I10 Essential (primary) hypertension: Secondary | ICD-10-CM | POA: Insufficient documentation

## 2015-11-02 DIAGNOSIS — X500XXA Overexertion from strenuous movement or load, initial encounter: Secondary | ICD-10-CM | POA: Diagnosis not present

## 2015-11-02 DIAGNOSIS — Y9289 Other specified places as the place of occurrence of the external cause: Secondary | ICD-10-CM | POA: Insufficient documentation

## 2015-11-02 DIAGNOSIS — Z79899 Other long term (current) drug therapy: Secondary | ICD-10-CM | POA: Insufficient documentation

## 2015-11-02 DIAGNOSIS — F17210 Nicotine dependence, cigarettes, uncomplicated: Secondary | ICD-10-CM | POA: Diagnosis not present

## 2015-11-02 DIAGNOSIS — Y9389 Activity, other specified: Secondary | ICD-10-CM | POA: Insufficient documentation

## 2015-11-02 DIAGNOSIS — S46012A Strain of muscle(s) and tendon(s) of the rotator cuff of left shoulder, initial encounter: Secondary | ICD-10-CM | POA: Diagnosis not present

## 2015-11-02 DIAGNOSIS — M62838 Other muscle spasm: Secondary | ICD-10-CM

## 2015-11-02 DIAGNOSIS — M25512 Pain in left shoulder: Secondary | ICD-10-CM

## 2015-11-02 DIAGNOSIS — S46912A Strain of unspecified muscle, fascia and tendon at shoulder and upper arm level, left arm, initial encounter: Secondary | ICD-10-CM

## 2015-11-02 MED ORDER — CYCLOBENZAPRINE HCL 10 MG PO TABS: 10.0000 mg | ORAL_TABLET | Freq: Three times a day (TID) | ORAL | Status: DC | PRN

## 2015-11-02 MED ORDER — NAPROXEN 500 MG PO TABS: 500.0000 mg | ORAL_TABLET | Freq: Two times a day (BID) | ORAL | Status: DC

## 2015-11-02 MED ORDER — KETOROLAC TROMETHAMINE 30 MG/ML IJ SOLN
30.0000 mg | Freq: Once | INTRAMUSCULAR | Status: AC
  Administered 2015-11-02: 30 mg via INTRAMUSCULAR
  Filled 2015-11-02: qty 1

## 2015-11-02 NOTE — Discharge Instructions (Signed)
Use heat to your shoulder throughout the day, using a heat pack for 20 minutes at a time every hour. Alternate between naprosyn and tylenol for pain relief. Avoid heavy lifting. Call orthopedic follow up today or tomorrow to schedule followup appointment for recheck of ongoing shoulder pain in 1-2 weeks that can be canceled with a 24-48 hour notice if complete resolution of pain. Return to the ER for changes or worsening symptoms.    Shoulder Pain The shoulder is the joint that connects your arm to your body. Muscles and band-like tissues that connect bones to muscles (tendons) hold the joint together. Shoulder pain is felt if an injury or medical problem affects one or more parts of the shoulder. HOME CARE   Put ice on the sore area.  Put ice in a plastic bag.  Place a towel between your skin and the bag.  Leave the ice on for 15-20 minutes, 03-04 times a day for the first 2 days.  Stop using cold packs if they do not help with the pain.  If you were given something to keep your shoulder from moving (sling; shoulder immobilizer), wear it as told. Only take it off to shower or bathe.  Move your arm as little as possible, but keep your hand moving to prevent puffiness (swelling).  Squeeze a soft ball or foam pad as much as possible to help prevent swelling.  Take medicine as told by your doctor. GET HELP IF:  You have progressing new pain in your arm, hand, or fingers.  Your hand or fingers get cold.  Your medicine does not help lessen your pain. GET HELP RIGHT AWAY IF:   Your arm, hand, or fingers are numb or tingling.  Your arm, hand, or fingers are puffy (swollen), painful, or turn white or blue. MAKE SURE YOU:   Understand these instructions.  Will watch your condition.  Will get help right away if you are not doing well or get worse.   This information is not intended to replace advice given to you by your health care provider. Make sure you discuss any questions you  have with your health care provider.   Document Released: 04/23/2008 Document Revised: 11/26/2014 Document Reviewed: 02/28/2015 Elsevier Interactive Patient Education 2016 Elsevier Inc.  Muscle Strain A muscle strain is an injury that occurs when a muscle is stretched beyond its normal length. Usually a small number of muscle fibers are torn when this happens. Muscle strain is rated in degrees. First-degree strains have the least amount of muscle fiber tearing and pain. Second-degree and third-degree strains have increasingly more tearing and pain.  Usually, recovery from muscle strain takes 1-2 weeks. Complete healing takes 5-6 weeks.  CAUSES  Muscle strain happens when a sudden, violent force placed on a muscle stretches it too far. This may occur with lifting, sports, or a fall.  RISK FACTORS Muscle strain is especially common in athletes.  SIGNS AND SYMPTOMS At the site of the muscle strain, there may be:  Pain.  Bruising.  Swelling.  Difficulty using the muscle due to pain or lack of normal function. DIAGNOSIS  Your health care provider will perform a physical exam and ask about your medical history. TREATMENT  Often, the best treatment for a muscle strain is resting, icing, and applying cold compresses to the injured area.  HOME CARE INSTRUCTIONS   Use the PRICE method of treatment to promote muscle healing during the first 2-3 days after your injury. The PRICE method involves:  Protecting the muscle from being injured again.  Restricting your activity and resting the injured body part.  Icing your injury. To do this, put ice in a plastic bag. Place a towel between your skin and the bag. Then, apply the ice and leave it on from 15-20 minutes each hour. After the third day, switch to moist heat packs.  Apply compression to the injured area with a splint or elastic bandage. Be careful not to wrap it too tightly. This may interfere with blood circulation or increase  swelling.  Elevate the injured body part above the level of your heart as often as you can.  Only take over-the-counter or prescription medicines for pain, discomfort, or fever as directed by your health care provider.  Warming up prior to exercise helps to prevent future muscle strains. SEEK MEDICAL CARE IF:   You have increasing pain or swelling in the injured area.  You have numbness, tingling, or a significant loss of strength in the injured area. MAKE SURE YOU:   Understand these instructions.  Will watch your condition.  Will get help right away if you are not doing well or get worse.   This information is not intended to replace advice given to you by your health care provider. Make sure you discuss any questions you have with your health care provider.   Document Released: 11/05/2005 Document Revised: 08/26/2013 Document Reviewed: 06/04/2013 Elsevier Interactive Patient Education 2016 Elsevier Inc.  Muscle Cramps and Spasms Muscle cramps and spasms occur when a muscle or muscles tighten and you have no control over this tightening (involuntary muscle contraction). They are a common problem and can develop in any muscle. The most common place is in the calf muscles of the leg. Both muscle cramps and muscle spasms are involuntary muscle contractions, but they also have differences:   Muscle cramps are sporadic and painful. They may last a few seconds to a quarter of an hour. Muscle cramps are often more forceful and last longer than muscle spasms.  Muscle spasms may or may not be painful. They may also last just a few seconds or much longer. CAUSES  It is uncommon for cramps or spasms to be due to a serious underlying problem. In many cases, the cause of cramps or spasms is unknown. Some common causes are:   Overexertion.   Overuse from repetitive motions (doing the same thing over and over).   Remaining in a certain position for a long period of time.   Improper  preparation, form, or technique while performing a sport or activity.   Dehydration.   Injury.   Side effects of some medicines.   Abnormally low levels of the salts and ions in your blood (electrolytes), especially potassium and calcium. This could happen if you are taking water pills (diuretics) or you are pregnant.  Some underlying medical problems can make it more likely to develop cramps or spasms. These include, but are not limited to:   Diabetes.   Parkinson disease.   Hormone disorders, such as thyroid problems.   Alcohol abuse.   Diseases specific to muscles, joints, and bones.   Blood vessel disease where not enough blood is getting to the muscles.  HOME CARE INSTRUCTIONS   Stay well hydrated. Drink enough water and fluids to keep your urine clear or pale yellow.  It may be helpful to massage, stretch, and relax the affected muscle.  For tight or tense muscles, use a warm towel, heating pad, or hot shower water  directed to the affected area.  If you are sore or have pain after a cramp or spasm, applying ice to the affected area may relieve discomfort.  Put ice in a plastic bag.  Place a towel between your skin and the bag.  Leave the ice on for 15-20 minutes, 03-04 times a day.  Medicines used to treat a known cause of cramps or spasms may help reduce their frequency or severity. Only take over-the-counter or prescription medicines as directed by your caregiver. SEEK MEDICAL CARE IF:  Your cramps or spasms get more severe, more frequent, or do not improve over time.  MAKE SURE YOU:   Understand these instructions.  Will watch your condition.  Will get help right away if you are not doing well or get worse.   This information is not intended to replace advice given to you by your health care provider. Make sure you discuss any questions you have with your health care provider.   Document Released: 04/27/2002 Document Revised: 03/02/2013 Document  Reviewed: 10/22/2012 Elsevier Interactive Patient Education 2016 Elsevier Inc.  [employer] LockerHeat Therapy Heat therapy can help ease sore, stiff, injured, and tight muscles and joints. Heat relaxes your muscles, which may help ease your pain. Heat therapy should only be used on old, pre-existing, or long-lasting (chronic) injuries. Do not use heat therapy unless told by your doctor. HOW TO USE HEAT THERAPY There are several different kinds of heat therapy, including:  Moist heat pack.  Warm water bath.  Hot water bottle.  Electric heating pad.  Heated gel pack.  Heated wrap.  Electric heating pad. GENERAL HEAT THERAPY RECOMMENDATIONS   Do not sleep while using heat therapy. Only use heat therapy while you are awake.  Your skin may turn pink while using heat therapy. Do not use heat therapy if your skin turns red.  Do not use heat therapy if you have new pain.  High heat or long exposure to heat can cause burns. Be careful when using heat therapy to avoid burning your skin.  Do not use heat therapy on areas of your skin that are already irritated, such as with a rash or sunburn. GET HELP IF:   You have blisters, redness, swelling (puffiness), or numbness.  You have new pain.  Your pain is worse. MAKE SURE YOU:  Understand these instructions.  Will watch your condition.  Will get help right away if you are not doing well or get worse.   This information is not intended to replace advice given to you by your health care provider. Make sure you discuss any questions you have with your health care provider.   Document Released: 01/28/2012 Document Revised: 11/26/2014 Document Reviewed: 12/29/2013 Elsevier Interactive Patient Education [employer] Elsevier Inc.

## 2015-11-02 NOTE — ED Notes (Signed)
Pt arrived by POV, c/o pain around the left shoulder blade ongoing for about 1-1.5 weeks. Hurts bad enough that patient is unable to sleep. Denies chest pain, radiation, n/v, dizziness or lightheadedness. Pt does do strenuous work.

## 2015-11-02 NOTE — ED Provider Notes (Signed)
CSN: [id number]     Arrival date & time 11/02/15  0522 History   First MD Initiated Contact with Patient 11/02/15 [phone]     Chief Complaint  Patient presents with  . Shoulder Pain     (Consider location/radiation/quality/duration/timing/severity/associated sxs/prior Treatment) HPI Comments: Derrick Foster is a 54 y.o. male with a PMHx of HTN, who presents to the ED with complaints of left shoulder pain 1.5 weeks.  Patient describes the pain is 6/10 constant dull and aching in the posterior left shoulder, nonradiating, worse with laying down, and with no significant improvement after ibuprofen.  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  He denies any fevers, chills, chest pain, shortness of breath, abdominal pain, nausea, vomiting, diarrhea, constipation, dysuria, hematuria, numbness, tingling, weakness, swelling, erythema, or warmth. No falls/trauma.  Patient is a 54 y.o. male presenting with shoulder pain. The history is provided by the patient. No language interpreter was used.  Shoulder Pain Location:  Shoulder Time since incident:  1 week Injury: no   Shoulder location:  L shoulder Pain details:    Quality:  Aching and dull   Radiates to:  Does not radiate   Severity:  Moderate   Onset quality:  Gradual   Duration:  1 week   Timing:  Constant   Progression:  Unchanged Chronicity:  New Prior injury to area:  No Relieved by:  Nothing Exacerbated by: laying down. Ineffective treatments:  NSAIDs Associated symptoms: no back pain, no decreased range of motion, no fever, no muscle weakness, no neck pain, no numbness, no swelling and no tingling     Past Medical History  Diagnosis Date  . Hypertension    History reviewed. No pertinent past surgical history. History reviewed. No pertinent family history. Social History  Substance Use Topics  . Smoking status: Current Every Day Smoker -- 0.50  packs/day    Types: Cigarettes  . Smokeless tobacco: Current User  . Alcohol Use: 0.6 oz/week    1 Shots of liquor per week     Comment: every day    Review of Systems  Constitutional: Negative for fever and chills.  Respiratory: Negative for shortness of breath.   Cardiovascular: Negative for chest pain.  Gastrointestinal: Negative for nausea, vomiting, abdominal pain, diarrhea and constipation.  Genitourinary: Negative for dysuria and hematuria.  Musculoskeletal: Positive for myalgias (L shoulder). Negative for back pain, arthralgias and neck pain.  Skin: Negative for color change.  Allergic/Immunologic: Negative for immunocompromised state.  Neurological: Negative for weakness and numbness.  Psychiatric/Behavioral: Negative for confusion.   10 Systems reviewed and are negative for acute change except as noted in the HPI.    Allergies  Review of patient's allergies indicates no known allergies.  Home Medications   Prior to Admission medications   Medication Sig Start Date End Date Taking? Authorizing Provider  aspirin EC 81 MG tablet Take 81 mg by mouth daily.    Historical Provider, MD  ibuprofen (ADVIL,MOTRIN) 400 MG tablet Take 1 tablet (400 mg total) by mouth every 6 (six) hours as needed. 10/15/15   Joycie PeekBenjamin Cartner, PA-C  Multiple Vitamin (MULTIVITAMIN WITH MINERALS) TABS Take 1 tablet by mouth daily.    Historical Provider, MD  naproxen (NAPROSYN) 500 MG tablet Take 1 tablet (500 mg total) by mouth 2 (two) times daily with a meal. 01/30/14   Arthor CaptainAbigail Harris, PA-C  naproxen sodium (ANAPROX) 220 MG  tablet Take 440 mg by mouth 2 (two) times daily as needed (pain).    Historical Provider, MD  Probiotic Product (PROBIOTIC DAILY PO) Take 1 capsule by mouth daily.     Historical Provider, MD  traMADol (ULTRAM) 50 MG tablet Take 50 mg by mouth once.    Historical Provider, MD   BP 142/94 mmHg  Pulse 62  Temp(Src) 98.1 F (36.7 C) (Oral)  Resp 20  SpO2 97% Physical Exam    Constitutional: He is oriented to person, place, and time. Vital signs are normal. He appears well-developed and well-nourished.  Non-toxic appearance. No distress.  Afebrile, nontoxic, NAD  HENT:  Head: Normocephalic and atraumatic.  Mouth/Throat: Oropharynx is clear and moist and mucous membranes are normal.  Eyes: Conjunctivae and EOM are normal. Right eye exhibits no discharge. Left eye exhibits no discharge.  Neck: Normal range of motion. Neck supple.  Cardiovascular: Normal rate, regular rhythm, normal heart sounds and intact distal pulses.  Exam reveals no gallop and no friction rub.   No murmur heard. Pulmonary/Chest: Effort normal and breath sounds normal. No respiratory distress. He has no decreased breath sounds. He has no wheezes. He has no rhonchi. He has no rales.  Abdominal: Soft. Normal appearance and bowel sounds are normal. He exhibits no distension. There is no tenderness. There is no rigidity, no rebound and no guarding.  Musculoskeletal: Normal range of motion.       Left shoulder: He exhibits tenderness and spasm. He exhibits normal range of motion, no bony tenderness, no swelling, no effusion, no crepitus, no deformity, normal pulse and normal strength.       Arms: L shoulder with FROM intact, no bony TTP, with mild muscular TTP adjacent to scapula, with palpable spasms, no swelling/effusion, no crepitus/deformity, negative apley scratch, neg pain with resisted int/ext rotation, neg empty can test. Strength and sensation grossly intact in all extremities, distal pulses intact.    Neurological: He is alert and oriented to person, place, and time. He has normal strength. No sensory deficit.  Skin: Skin is warm, dry and intact. No rash noted.  Psychiatric: He has a normal mood and affect.  Nursing note and vitals reviewed.   ED Course  Procedures (including critical care time) Labs Review Labs Reviewed - No data to display  Imaging Review No results found. I have  personally reviewed and evaluated these images and lab results as part of my medical decision-making.   EKG Interpretation   Date/Time:  Wednesday November 02 2015 05:55:44 EST Ventricular Rate:  65 PR Interval:  154 QRS Duration: 94 QT Interval:  438 QTC Calculation: 455 R Axis:   65 Text Interpretation:  Sinus rhythm no acute ischemia No significant change  since last tracing Confirmed by Kandis Mannan ([id number]) on 11/02/2015  6:30:39 AM      MDM   Final diagnoses:  Left shoulder pain  Muscle strain, shoulder region, left, initial encounter  Muscle spasm of left shoulder area    54 y.o. male here with L shoulder pain x1.5 wks. Heavy lifting at work, lots of strenuous activity. On exam, diffuse tenderness to musculature around scapula, NVI with soft compartments. No focal bony TTP. Doubt need for xray imaging. EKG unremarkable. Doubt need for further labs/imaging. Likely muscle strain. Will give toradol IM and d/c home with naprosyn and flexeril. Discussed f/up with ortho for ongoing pain in 1-2wks. Discussed use of heat. I explained the diagnosis and have given explicit precautions to return to the ER  including for any other new or worsening symptoms. The patient understands and accepts the medical plan as it's been dictated and I have answered their questions. Discharge instructions concerning home care and prescriptions have been given. The patient is STABLE and is discharged to home in good condition.  BP 133/89 mmHg  Pulse 55  Temp(Src) 98.1 F (36.7 C) (Oral)  Resp 13  SpO2 97%  Meds ordered this encounter  Medications  . ketorolac (TORADOL) 30 MG/ML injection 30 mg    Sig:   . naproxen (NAPROSYN) 500 MG tablet    Sig: Take 1 tablet (500 mg total) by mouth 2 (two) times daily with a meal. x5-7 days    Dispense:  14 tablet    Refill:  0    Order Specific Question:  Supervising Provider    Answer:  MILLER, BRIAN [[id number]]  . cyclobenzaprine (FLEXERIL) 10 MG tablet     Sig: Take 1 tablet (10 mg total) by mouth 3 (three) times daily as needed for muscle spasms.    Dispense:  15 tablet    Refill:  0    Order Specific Question:  Supervising Provider    Answer:  Eber Hong [[id number]]       Jacobs Golab Camprubi-Soms, PA-C 11/02/15 1610  Courteney Randall An, MD 11/02/15 [phone]

## 2016-03-30 ENCOUNTER — Encounter (HOSPITAL_COMMUNITY): Payer: Self-pay | Admitting: Emergency Medicine

## 2016-03-30 ENCOUNTER — Ambulatory Visit (HOSPITAL_COMMUNITY)
Admission: EM | Admit: 2016-03-30 | Discharge: 2016-03-30 | Disposition: A | Discharge status: Home / Self Care | Payer: Non-veteran care | Attending: Family Medicine | Admitting: Family Medicine

## 2016-03-30 DIAGNOSIS — K0889 Other specified disorders of teeth and supporting structures: Secondary | ICD-10-CM | POA: Diagnosis not present

## 2016-03-30 DIAGNOSIS — Z9189 Other specified personal risk factors, not elsewhere classified: Secondary | ICD-10-CM | POA: Diagnosis not present

## 2016-03-30 MED ORDER — AMOXICILLIN 500 MG PO CAPS: 1000.0000 mg | ORAL_CAPSULE | Freq: Two times a day (BID) | ORAL | Status: DC

## 2016-03-30 MED ORDER — HYDROCODONE-ACETAMINOPHEN 5-325 MG PO TABS: 1.0000 | ORAL_TABLET | ORAL | Status: DC | PRN

## 2016-03-30 NOTE — Discharge Instructions (Signed)
You have been given Norco for pain which contains hydrocodone a narcotic which can cause drowsiness. Do not drive or operate machinery while taking this medication. He may also continue to take ibuprofen as needed to help with pain, inflammation and swelling. Keep appointment with Dentist

## 2016-03-30 NOTE — ED Notes (Signed)
C/o left lower side dental pain onset x2 days associated w/swelling A&O x4... No acute distress.

## 2016-03-30 NOTE — ED Provider Notes (Signed)
CSN: [id number]     Arrival date & time 03/30/16  1256 History   First MD Initiated Contact with Patient 03/30/16 1331     Chief Complaint  Patient presents with  . Dental Pain   (Consider location/radiation/quality/duration/timing/severity/associated sxs/prior Treatment) HPI Comments: 55 year old male complaining of a toothache in the left lower jaw. He states that he has a painful tooth that is loose and this started to hurt 2 days ago. It is associated with mild left lower facial swelling. He has been applying ice off and on as well as taking ibuprofen. This has helped with the pain as well as decrease the left facial swelling. He has an appointment with a dentist in approximately one week.   Past Medical History  Diagnosis Date  . Hypertension    History reviewed. No pertinent past surgical history. No family history on file. Social History  Substance Use Topics  . Smoking status: Current Every Day Smoker -- 0.50 packs/day    Types: Cigarettes  . Smokeless tobacco: Current User  . Alcohol Use: 0.6 oz/week    1 Shots of liquor per week     Comment: every day    Review of Systems  Constitutional: Negative.   HENT: Positive for dental problem and facial swelling. Negative for mouth sores.   Respiratory: Negative.   Gastrointestinal: Negative.   Neurological: Negative.     Allergies  Review of patient's allergies indicates no known allergies.  Home Medications   Prior to Admission medications   Medication Sig Start Date End Date Taking? Authorizing Provider  amLODipine (NORVASC) 10 MG tablet Take 10 mg by mouth daily.   Yes Historical Provider, MD  hydrochlorothiazide (HYDRODIURIL) 25 MG tablet Take 25 mg by mouth daily.   Yes Historical Provider, MD  Probiotic Product (PROBIOTIC DAILY PO) Take 1 capsule by mouth daily.    Yes Historical Provider, MD  amoxicillin (AMOXIL) 500 MG capsule Take 2 capsules (1,000 mg total) by mouth 2 (two) times daily. 03/30/16   Hayden Rasmussen,  NP  aspirin EC 81 MG tablet Take 81 mg by mouth daily.    Historical Provider, MD  cyclobenzaprine (FLEXERIL) 10 MG tablet Take 1 tablet (10 mg total) by mouth 3 (three) times daily as needed for muscle spasms. 11/02/15   Mercedes Camprubi-Soms, PA-C  HYDROcodone-acetaminophen (NORCO/VICODIN) 5-325 MG tablet Take 1 tablet by mouth every 4 (four) hours as needed. 03/30/16   Hayden Rasmussen, NP  ibuprofen (ADVIL,MOTRIN) 400 MG tablet Take 1 tablet (400 mg total) by mouth every 6 (six) hours as needed. Patient taking differently: Take 400 mg by mouth every 6 (six) hours as needed for moderate pain.  10/15/15   Joycie Peek, PA-C  Multiple Vitamin (MULTIVITAMIN WITH MINERALS) TABS Take 1 tablet by mouth daily.    Historical Provider, MD   Meds Ordered and Administered this Visit  Medications - No data to display  BP 137/87 mmHg  Pulse 78  Temp(Src) 98 F (36.7 C) (Oral)  Resp 18  SpO2 95% No data found.   Physical Exam  Constitutional: He is oriented to person, place, and time. He appears well-developed and well-nourished. No distress.  HENT:  Mouth/Throat: No oropharyngeal exudate.  Poor dentition. All of his teeth are eroded and in various stages of decay. The tooth with the pain and dental tenderness is tooth #20 which is the second bicuspid in the left lower jaw. The tooth is loose and appears to be ready to be pulled. No surrounding erythema or abscess  formation. No drainage or purulence or bleeding.  Eyes: EOM are normal.  Neck: Normal range of motion. Neck supple.  Cardiovascular: Normal rate.   Pulmonary/Chest: Effort normal. No respiratory distress.  Musculoskeletal: He exhibits no edema.  Neurological: He is alert and oriented to person, place, and time. He exhibits normal muscle tone.  Skin: Skin is warm and dry.  Psychiatric: He has a normal mood and affect.  Nursing note and vitals reviewed.   ED Course  Procedures (including critical care time)  Labs Review Labs  Reviewed - No data to display  Imaging Review No results found.   Visual Acuity Review  Right Eye Distance:   Left Eye Distance:   Bilateral Distance:    Right Eye Near:   Left Eye Near:    Bilateral Near:         MDM   1. Pain, dental   2. Poor dental hygiene    You have been given Norco for pain which contains hydrocodone a narcotic which can cause drowsiness. Do not drive or operate machinery while taking this medication. He may also continue to take ibuprofen as needed to help with pain, inflammation and swelling. Keep appointment with Dentist Meds ordered this encounter  Medications  . amoxicillin (AMOXIL) 500 MG capsule    Sig: Take 2 capsules (1,000 mg total) by mouth 2 (two) times daily.    Dispense:  32 capsule    Refill:  0    Order Specific Question:  Supervising Provider    Answer:  Charm RingsHONIG, ERIN J [id number]]  . HYDROcodone-acetaminophen (NORCO/VICODIN) 5-325 MG tablet    Sig: Take 1 tablet by mouth every 4 (four) hours as needed.    Dispense:  15 tablet    Refill:  0    Order Specific Question:  Supervising Provider    Answer:  Micheline ChapmanHONIG, ERIN J [[id number]]       Hayden Rasmussenavid Bralen Wiltgen, NP 03/30/16 1345

## 2017-07-25 ENCOUNTER — Ambulatory Visit: Payer: Non-veteran care | Attending: Physical Therapy | Admitting: Physical Therapy

## 2017-07-25 ENCOUNTER — Encounter: Payer: Self-pay | Admitting: Physical Therapy

## 2017-07-25 DIAGNOSIS — G8929 Other chronic pain: Secondary | ICD-10-CM | POA: Insufficient documentation

## 2017-07-25 DIAGNOSIS — R293 Abnormal posture: Secondary | ICD-10-CM | POA: Insufficient documentation

## 2017-07-25 DIAGNOSIS — M25611 Stiffness of right shoulder, not elsewhere classified: Secondary | ICD-10-CM

## 2017-07-25 DIAGNOSIS — M25511 Pain in right shoulder: Secondary | ICD-10-CM | POA: Insufficient documentation

## 2017-07-25 NOTE — Therapy (Signed)
[facility] [address] [place], [place], [address] Phone: [phone]   Fax:  [phone]  Physical Therapy Evaluation  Patient Details  Name: Derrick Foster MRN: [id number] Date of Birth: [birth year] Referring Provider: [facility] PT, Janine Ores MD  Encounter Date: 07/25/2017      PT End of Session - 07/25/17 0808    Visit Number 1   Number of Visits 13   Date for PT Re-Evaluation 09/05/17   Authorization Type [facility]    Authorization Time Period 09-05-17   PT Start Time 0800   PT Stop Time 0844   PT Time Calculation (min) 44 min      Past Medical History:  Diagnosis Date  . Hypertension     History reviewed. No pertinent surgical history.  There were no vitals filed for this visit.       Subjective Assessment - 07/25/17 0812    Subjective I have been noticing  more increasing pain with certain movements when I cross my  arm across my body.  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  I  also drive a fork lift   Limitations Lifting;House hold activities  above 90 degree flexion   How long can you sit comfortably? unlimited   How long can you stand comfortably? unliimited   How long can you walk comfortably? unlimitied   Diagnostic tests x ray   Currently in Pain? Yes   Pain Score 5    Pain Location Shoulder   Pain Orientation Right   Pain Descriptors / Indicators Sharp   Pain Type Chronic pain   Pain Onset More than a month ago   Pain Frequency Intermittent   Aggravating Factors   Driving forklift and using steering wheel one handed, lifting above my head, bringing my arm across my body   Pain Relieving Factors ibuprofen            [facility] PT Assessment - 07/25/17 0818      Assessment   Medical Diagnosis Right arm pain   Referring Provider [facility], Janine Ores MD   Onset Date/Surgical Date --  about 6 months ago   Hand  Dominance Right   Next MD Visit --  nothing scheduled   Prior Therapy 1 x with Apolinar Junes sheets     Precautions   Precautions None     Restrictions   Weight Bearing Restrictions No     Balance Screen   Has the patient fallen in the past 6 months No   Has the patient had a decrease in activity level because of a fear of falling?  No   Is the patient reluctant to leave their home because of a fear of falling?  No     Home Environment   Living Environment Private residence   Living Arrangements Non-relatives/Friends   Type of Home House   Home Layout One level     Prior Function   Level of Independence Independent   Vocation Full time employment   Field seismologist     Cognition   Overall Cognitive Status Within Functional Limits for tasks assessed     Observation/Other Assessments   Focus on Therapeutic Outcomes (FOTO)  FOTO intake 63%, limitaiton 37%, predicted 26%     Posture/Postural Control   Posture/Postural Control Postural limitations   Postural Limitations Rounded Shoulders;Forward head  right more forward  ROM / Strength   AROM / PROM / Strength AROM;PROM;Strength     AROM   Overall AROM  Deficits   Right Shoulder Flexion 152 Degrees   Right Shoulder ABduction 148 Degrees   Right Shoulder Internal Rotation 35 Degrees   Right Shoulder External Rotation 85 Degrees   Left Shoulder Flexion 163 Degrees   Left Shoulder ABduction 160 Degrees   Left Shoulder Internal Rotation 45 Degrees   Left Shoulder External Rotation 90 Degrees     PROM   Overall PROM  Deficits   Right Shoulder Internal Rotation 40 Degrees  pain   Right Shoulder External Rotation 90 Degrees   Left Shoulder Internal Rotation 55 Degrees   Left Shoulder External Rotation 90 Degrees     Strength   Overall Strength Deficits   Right Shoulder Flexion 5/5   Right Shoulder ABduction 5/5   Right Shoulder Internal Rotation 5/5   Right Shoulder External  Rotation 4/5  pain on end range   Left Shoulder Flexion 5/5   Left Shoulder ABduction 5/5   Left Shoulder Internal Rotation 5/5   Left Shoulder External Rotation 5/5     Palpation   Palpation comment tenderness over            Objective measurements completed on examination: See above findings.          [facility] Adult PT Treatment/Exercise - 07/25/17 0818      Self-Care   Self-Care Posture   Posture sitting and standing posture initial education and isometric ext of right shoulder x 10                PT Education - 07/25/17 0845    Education provided Yes   Education Details POC, explanation of findings.  initial posture sitting and standing.    Person(s) Educated Patient   Methods Explanation;Demonstration;Tactile cues;Verbal cues;Handout   Comprehension Verbalized understanding;Returned demonstration          PT Short Term Goals - 07/25/17 0839      PT SHORT TERM GOAL #1   Title STG= LTG           PT Long Term Goals - 07/25/17 0840      PT LONG TERM GOAL #1   Title Pt will be independent with advanced HEP    Time 6   Period Weeks   Status New   Target Date 09/05/17     PT LONG TERM GOAL #2   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 and 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   Time 6   Period Weeks   Status New   Target Date 09/05/17     PT LONG TERM GOAL #3   Title Pt will be able to have at least 50 degrees of right internal rotation  1/10 pain or less in order to complete work tasks with minimal discomfort to perform calisthenics for work   Time 6   Period Weeks   Status New   Target Date 09/05/17     PT LONG TERM GOAL #4   Title "FOTO will improve from 37% limitation   to 20% limitation    indicating improved functional mobility    Time 6   Period Weeks   Status New   Target Date 09/05/17     PT LONG TERM GOAL #5   Title Pt will be able to carry up to 30 lb with proper posture without exacebating right  shoulder pain.  Time 6   Period Weeks   Status New   Target Date 09/05/17                Plan - 08/08/17 1610    Clinical Impression Statement Pt presents with signs and symptoms compatible with impingement with rotator cuff tendinitis on right. Pt presents with impairments including pain, limited ROM, weakness, and as a result, pt is limited with work activities and functional activities. Pt would benefit from skilled outpatient PT services for 2 times a week for 6 weeks to progress toward pain-free PLOF.    Clinical Presentation Stable   Clinical Decision Making Low   Rehab Potential Good   PT Frequency 2x / week  1 eval and 12 visit limit   PT Duration 6 weeks   PT Treatment/Interventions Taping;Dry needling;Passive range of motion;Manual techniques;Therapeutic activities;Therapeutic exercise;Neuromuscular re-education;Patient/family education;Ultrasound;Moist Heat;Iontophoresis /ml Dexamethasone;Cryotherapy   PT Next Visit Plan PLease give HEP for scapular stabilization for Right shoulder impingement   PT Home Exercise Plan sitting and standning posture, isometric shld extension into wall    Consulted and Agree with Plan of Care Patient      Patient will benefit from skilled therapeutic intervention in order to improve the following deficits and impairments:  Pain, Postural dysfunction, Improper body mechanics, Impaired UE functional use, Decreased strength, Decreased range of motion  Visit Diagnosis: Chronic right shoulder pain  Abnormal posture  Stiffness of right shoulder, not elsewhere classified      G-Codes - 08-Aug-2017 1738    Functional Assessment Tool Used (Outpatient Only) FOTO   Functional Limitation Carrying, moving and handling objects   Carrying, Moving and Handling Objects Current Status (R6045) At least 20 percent but less than 40 percent impaired, limited or restricted  37%   Carrying, Moving and Handling Objects Goal Status (W0981) At least 1  percent but less than 20 percent impaired, limited or restricted  20%       Problem List There are no active problems to display for this patient.  Garen Lah, PT Certified Exercise Expert for the Aging Adult  2017-08-08 5:46 PM Phone: [phone] Fax: [phone]  [facility] [address] [place], [place], [address] Phone: [phone]   Fax:  [phone]  Name: CARLY APPLEGATE MRN: [id number] Date of Birth: 1960/12/03

## 2017-07-25 NOTE — Patient Instructions (Signed)
Posture Tips DO: - stand tall and erect - keep chin tucked in - keep head and shoulders in alignment - check posture regularly in mirror or large window - pull head back against headrest in car seat;  Change your position often.  Sit with lumbar support. DON'T: - slouch or slump while watching TV or reading - sit, stand or lie in one position  for too long;  Sitting is especially hard on the spine so if you sit at a desk/use the computer, then stand up often!   Copyright  VHI. All rights reserved.  Posture - Standing   Good posture is important. Avoid slouching and forward head thrust. Maintain curve in low back and align ears over shoul- ders, hips over ankles.  Pull your belly button in toward your back bone.  Even weight in your feet, soft knees. Belly button to spine, ribs lifted up. ( Not miliatary))  Chin down,  Be aware during the day   Copyright  VHI. All rights reserved.  Posture - Sitting   Sit upright, head facing forward. Try using a roll to support lower back. Keep shoulders relaxed, and avoid rounded back. Keep hips level with knees. Avoid crossing legs for long periods. Get a good lumbar support  And sit on your sit bones, not your tailbone.     Copyright  VHI. All rights reserved.  Standing with back at wall,  Do isometric extension of right arm into wall with scapular retraction.  15 x 2   2 -3 times a day.  . This is a low row.   Lawrie Beardsley, PT CertGaren Lahified Exercise Expert for the Aging Adult  07/25/17 8:34 AM Phone: [phone] Fax: [phone]

## 2017-08-01 ENCOUNTER — Ambulatory Visit: Payer: Non-veteran care | Admitting: Physical Therapy

## 2017-08-01 ENCOUNTER — Encounter: Payer: Self-pay | Admitting: Physical Therapy

## 2017-08-01 DIAGNOSIS — G8929 Other chronic pain: Secondary | ICD-10-CM

## 2017-08-01 DIAGNOSIS — M25611 Stiffness of right shoulder, not elsewhere classified: Secondary | ICD-10-CM

## 2017-08-01 DIAGNOSIS — M25511 Pain in right shoulder: Principal | ICD-10-CM

## 2017-08-01 DIAGNOSIS — R293 Abnormal posture: Secondary | ICD-10-CM

## 2017-08-01 NOTE — Patient Instructions (Addendum)
Extension (Isometric)    Unlike the picture I want you to extend your elbow straight and press wrist into wall. .  Feel your corner of scapula go towards back pocket. Hold _10___ seconds. Repeat _15___ times. Do _3___ sessions per day.  http://gt2.exer.us/111   Copyright  VHI. All rights reserved.  All of these exercises to be done 1 to 2 times a day.  Over Head Pull: Narrow Grip       On back, knees bent, feet flat, band across thighs, elbows straight but relaxed. Pull hands apart (start). Keeping elbows straight, bring arms up and over head, hands toward floor. Keep pull steady on band. Hold momentarily. Return slowly, keeping pull steady, back to start. Repeat _15__ times. Band color ___red___   Side Pull: Double Arm   On back, knees bent, feet flat. Arms perpendicular to body, shoulder level, elbows straight but relaxed. Pull arms out to sides, elbows straight. Resistance band comes across collarbones, hands toward floor. Hold momentarily. Slowly return to starting position. Repeat _15__ times. Band color __red___   Derrick Foster   On back, knees bent, feet flat, left hand on left hip, right hand above left. Pull right arm DIAGONALLY (hip to shoulder) across chest. Bring right arm along head toward floor. Hold momentarily. Slowly return to starting position. Repeat _15__ times. Do with left arm. Band color __red____   Shoulder Rotation: Double Arm   On back, knees bent, feet flat, elbows tucked at sides, bent 90, hands palms up. Pull hands apart and down toward floor, keeping elbows near sides. Hold momentarily. Slowly return to starting position. Repeat _15_ times. Band color _red_____      Flexibility: Corner Stretch  Standing in corner with hands just above shoulder level and feet _6-8___ inches from corner, lean forward until a comfortable stretch is felt across chest. Hold 30____ seconds.  I like my elbows at 90 degrees touching wall Repeat __3__ times per set. Do __1__  sets per session. Do __2__ sessions per day.     Trigger Point Dry Needling  . What is Trigger Point Dry Needling (DN)? o DN is a physical therapy technique used to treat muscle pain and dysfunction. Specifically, DN helps deactivate muscle trigger points (muscle knots).  o A thin filiform needle is used to penetrate the skin and stimulate the underlying trigger point. The goal is for a local twitch response (LTR) to occur and for the trigger point to relax. No medication of any kind is injected during the procedure.   . What Does Trigger Point Dry Needling Feel Like?  o The procedure feels different for each individual patient. Some patients report that they do not actually feel the needle enter the skin and overall the process is not painful. Very mild bleeding may occur. However, many patients feel a deep cramping in the muscle in which the needle was inserted. This is the local twitch response.   Marland Kitchen How Will I feel after the treatment? o Soreness is normal, and the onset of soreness may not occur for a few hours. Typically this soreness does not last longer than two days.  o Bruising is uncommon, however; ice can be used to decrease any possible bruising.  o In rare cases feeling tired or nauseous after the treatment is normal. In addition, your symptoms may get worse before they get better, this period will typically not last longer than 24 hours.   . What Can I do After My Treatment? o Increase your hydration by  drinking more water for the next 24 hours. o You may place ice or heat on the areas treated that have become sore, however, do not use heat on inflamed or bruised areas. Heat often brings more relief post needling. o You can continue your regular activities, but vigorous activity is not recommended initially after the treatment for 24 hours. o DN is best combined with other physical therapy such as strengthening, stretching, and other therapies.    Garen LahLawrie Terrance Foster,  PT Certified Exercise Expert for the Aging Adult  08/01/17 8:05 AM Phone: [phone] Fax: [phone]

## 2017-08-01 NOTE — Therapy (Signed)
[facility] [address] [place], [place], [address] Phone: [phone]   Fax:  [phone]  Physical Therapy Treatment  Patient Details  Name: Pricilla Larssonlbert L Rempel MRN: [id number] Date of Birth: 27-Jul-1961 Referring Provider: Gae DryBrandon Sheets [facility] PT, Janine OresLombardo, Jessica MD  Encounter Date: 08/01/2017      PT End of Session - 08/01/17 0757    Visit Number 2   Number of Visits 13   Date for PT Re-Evaluation 09/05/17   Authorization Type VA    Authorization Time Period 09-05-17   PT Start Time 0800   PT Stop Time 0858   PT Time Calculation (min) 58 min   Activity Tolerance Patient tolerated treatment well   Behavior During Therapy [facility] for tasks assessed/performed      Past Medical History:  Diagnosis Date  . Hypertension     History reviewed. No pertinent surgical history.  There were no vitals filed for this visit.      Subjective Assessment - 08/01/17 0809    Subjective At the moment, I have very low pain.  I want to be able to do calisthenics at my work.   Limitations Lifting;House hold activities   How long can you sit comfortably? unlimited   How long can you stand comfortably? unliimited   How long can you walk comfortably? unlimitied   Diagnostic tests x ray   Patient Stated Goals I want to alleviate any pain and get stronger in my shoulders   Currently in Pain? Yes   Pain Score 2    Pain Location Shoulder   Pain Orientation Right   Pain Descriptors / Indicators Sharp   Pain Type Chronic pain   Pain Onset More than a month ago   Pain Frequency Intermittent                         [facility] Adult PT Treatment/Exercise - 08/01/17 0811      Self-Care   Self-Care Other Self-Care Comments   Other Self-Care Comments  education on precautians and aftercare of TDN     Shoulder Exercises: Supine   Other Supine Exercises supine scap stabiizers, flex, horizontal abd, diagonals and ER with red  banc x 10  each bil     Shoulder Exercises: Standing   Other Standing Exercises low row withback against wall 10 x 10 sec hold     Shoulder Exercises: Stretch   Corner Stretch 3 reps;30 seconds   Corner Stretch Limitations pt with VC/ TC for correct execution.  Pt says it feels good     Modalities   Modalities Moist Heat     Moist Heat Therapy   Number Minutes Moist Heat 15 Minutes   Moist Heat Location Shoulder  right     Manual Therapy   Manual Therapy Joint mobilization;Soft tissue mobilization   Joint Mobilization right shoulder mobs grade 3 for inf/post   Soft tissue mobilization right shoulder upper trap and periscapular          Trigger Point Dry Needling - 08/01/17 0846    Consent Given? Yes   Education Handout Provided Yes   Muscles Treated Upper Body Pectoralis minor;Pectoralis major;Upper trapezius  right side only Lats twitch   Upper Trapezius Response Twitch reponse elicited;Palpable increased muscle length   Pectoralis Major Response Twitch response elicited;Palpable increased muscle length   Pectoralis Minor Response Twitch response elicited;Palpable increased muscle length  PT Education - 08/01/17 0809    Education provided Yes   Education Details given intial HEP for scapular stabilizers and shld extension into wall  , education on Trigger point dry needling   Person(s) Educated Patient   Methods Explanation;Demonstration;Tactile cues;Verbal cues;Handout   Comprehension Verbalized understanding;Returned demonstration          PT Short Term Goals - 07/25/17 0839      PT SHORT TERM GOAL #1   Title STG= LTG           PT Long Term Goals - 07/25/17 0840      PT LONG TERM GOAL #1   Title Pt will be independent with advanced HEP    Time 6   Period Weeks   Status New   Target Date 09/05/17     PT LONG TERM GOAL #2   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 and 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   Time 6   Period  Weeks   Status New   Target Date 09/05/17     PT LONG TERM GOAL #3   Title Pt will be able to have at least 50 degrees of right internal rotation  1/10 pain or less in order to complete work tasks with minimal discomfort to perform calisthenics for work   Time 6   Period Weeks   Status New   Target Date 09/05/17     PT LONG TERM GOAL #4   Title "FOTO will improve from 37% limitation   to 20% limitation    indicating improved functional mobility    Time 6   Period Weeks   Status New   Target Date 09/05/17     PT LONG TERM GOAL #5   Title Pt will be able to carry up to 30 lb with proper posture without exacebating right shoulder pain.   Time 6   Period Weeks   Status New   Target Date 09/05/17               Plan - 08/01/17 1610    Clinical Impression Statement Pt presents with 2/10 pain in right shoulder with tight pectorals and trigger points in upper trap right and periscapular.  Pt educated on trigger point dry needling , aftercare and precautions. Mr. Diveley was closely monitored throughout session.  Pt with palpable lengthening of right pectoral, right lats, and upper trapezius.  TDN for right side only. Pt tolerated well   Rehab Potential Good   PT Frequency 2x / week   PT Duration 6 weeks   PT Treatment/Interventions Taping;Dry needling;Passive range of motion;Manual techniques;Therapeutic activities;Therapeutic exercise;Neuromuscular re-education;Patient/family education;Ultrasound;Moist Heat;Iontophoresis /ml Dexamethasone;Cryotherapy   PT Next Visit Plan Review HEP and assess TDN benefit, go over posture/body mechanics   PT Home Exercise Plan sitting and standning posture, isometric shld extension into wall , supine scapular stabilizers, corner stretch   Consulted and Agree with Plan of Care Patient      Patient will benefit from skilled therapeutic intervention in order to improve the following deficits and impairments:  Pain, Postural dysfunction, Improper  body mechanics, Impaired UE functional use, Decreased strength, Decreased range of motion  Visit Diagnosis: Chronic right shoulder pain  Abnormal posture  Stiffness of right shoulder, not elsewhere classified     Problem List There are no active problems to display for this patient.   Garen Lah, PT Certified Exercise Expert for the Aging Adult  08/01/17 8:48 AM Phone: [phone] Fax: [phone]  [facility] [address] [place], [place], [address] Phone: [phone]   Fax:  [phone]  Name: EARNEST MCGILLIS MRN: [id number] Date of Birth: Sep 07, 1961

## 2017-08-06 ENCOUNTER — Ambulatory Visit: Payer: Non-veteran care | Admitting: Physical Therapy

## 2017-08-06 ENCOUNTER — Encounter: Payer: Self-pay | Admitting: Physical Therapy

## 2017-08-06 DIAGNOSIS — M25611 Stiffness of right shoulder, not elsewhere classified: Secondary | ICD-10-CM

## 2017-08-06 DIAGNOSIS — M25511 Pain in right shoulder: Secondary | ICD-10-CM | POA: Diagnosis not present

## 2017-08-06 DIAGNOSIS — G8929 Other chronic pain: Secondary | ICD-10-CM

## 2017-08-06 DIAGNOSIS — R293 Abnormal posture: Secondary | ICD-10-CM

## 2017-08-06 NOTE — Therapy (Signed)
[facility] [address] [place], [place], [address] Phone: [phone]   Fax:  [phone]  Physical Therapy Treatment  Patient Details  Name: Derrick Foster MRN: [id number] Date of Birth: [birth year] Referring Provider: Gae Dry [facility] PT, Janine Ores MD  Encounter Date: 08/06/2017      PT End of Session - 08/06/17 0902    Visit Number 3   Number of Visits 13   Date for PT Re-Evaluation 09/05/17   Authorization Type VA    Authorization Time Period 09-05-17   PT Start Time 0850   PT Stop Time 0945   PT Time Calculation (min) 55 min   Activity Tolerance Patient tolerated treatment well   Behavior During Therapy [employer] for tasks assessed/performed      Past Medical History:  Diagnosis Date  . Hypertension     History reviewed. No pertinent surgical history.  There were no vitals filed for this visit.      Subjective Assessment - 08/06/17 0851    Subjective I have no pain at the moment. I have been feeling much better but I havent done the exercises as much   Limitations Lifting;House hold activities   Patient Stated Goals I want to alleviate any pain and get stronger in my shoulders   Currently in Pain? No/denies            [facility] PT Assessment - 08/06/17 0904      AROM   Right Shoulder Flexion 156 Degrees   Right Shoulder ABduction 150 Degrees   Right Shoulder Internal Rotation 49 Degrees   Right Shoulder External Rotation 90 Degrees     PROM   Right Shoulder Internal Rotation 52 Degrees  ERP   Right Shoulder External Rotation 95 Degrees                     [facility] Adult PT Treatment/Exercise - 08/06/17 0906      Shoulder Exercises: Supine   Other Supine Exercises supine scap stabiizers, flex, horizontal abd, diagonals and ER with green  band x 15 each bil     Shoulder Exercises: Standing   Other Standing Exercises low row withback against wall 10 x 10 sec hold     Shoulder  Exercises: ROM/Strengthening   UBE (Upper Arm Bike) 2 min forward and 2 min backward level 2 with VC for posture     Shoulder Exercises: Stretch   Corner Stretch 3 reps;30 seconds   Corner Stretch Limitations pt with VC/ TC for correct execution.  Pt says it feels good     Moist Heat Therapy   Number Minutes Moist Heat 15 Minutes   Moist Heat Location Shoulder  right side     Manual Therapy   Manual Therapy Joint mobilization;Soft tissue mobilization   Joint Mobilization right shoulder mobs grade 3 for inf/post, sub occipital release   Soft tissue mobilization right shoulder upper trap and periscapular          Trigger Point Dry Needling - 08/06/17 0912    Consent Given? Yes   Education Handout Provided --  verbally   Muscles Treated Upper Body Upper trapezius;Levator scapulae  right side only   Upper Trapezius Response Twitch reponse elicited;Palpable increased muscle length   Levator Scapulae Response Twitch response elicited;Palpable increased muscle length              PT Education - 08/06/17 0906    Education provided Yes   Education Details reviewed intial  HEP and increased t band to green from red     Person(s) Educated Patient   Methods Explanation;Demonstration;Tactile cues   Comprehension Verbalized understanding;Returned demonstration          PT Short Term Goals - 07/25/17 0839      PT SHORT TERM GOAL #1   Title STG= LTG           PT Long Term Goals - 08/06/17 0902      PT LONG TERM GOAL #1   Title Pt will be independent with advanced HEP    Baseline Pt given initial exercises program and is independent , needs to strengthen . 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  Pt is still taking ibuprofen for pain.  Pt states exercises are really helping with the pain.    Rehab Potential Good   PT Frequency 2x / week   PT Duration 6 weeks   PT Treatment/Interventions Taping;Dry needling;Passive range of motion;Manual techniques;Therapeutic activities;Therapeutic exercise;Neuromuscular re-education;Patient/family education;Ultrasound;Moist Heat;Iontophoresis /ml Dexamethasone;Cryotherapy   PT Next Visit Plan  standing scapular posture and moving into push ups. go over posture/body mechanics ADL, work on calisthenic moves from work as able   [employer] Exercise Plan sitting and standning posture, isometric shld extension into wall , supine scapular stabilizers, corner stretch green t band   Consulted and Agree with Plan of Care Patient      Patient will benefit from skilled therapeutic intervention in order to improve the following deficits and impairments:  Pain, Postural dysfunction, Improper body mechanics, Impaired UE functional use, Decreased strength, Decreased range of motion  Visit Diagnosis: Chronic right shoulder pain  Abnormal posture  Stiffness of right shoulder, not elsewhere classified     Problem List There are no active problems to display for this patient.   Garen Lah, PT Certified Exercise Expert for the Aging Adult  08/06/17 9:31 AM Phone: [phone] Fax: [phone]  [facility] [address] [place], [place],  [address] Phone: [phone]   Fax:  [phone]  Name: Derrick Foster MRN: [id number] Date of Birth: Mar 18, 1961

## 2017-08-09 ENCOUNTER — Ambulatory Visit: Payer: Non-veteran care | Admitting: Physical Therapy

## 2017-08-09 DIAGNOSIS — M25511 Pain in right shoulder: Principal | ICD-10-CM

## 2017-08-09 DIAGNOSIS — R293 Abnormal posture: Secondary | ICD-10-CM

## 2017-08-09 DIAGNOSIS — M25611 Stiffness of right shoulder, not elsewhere classified: Secondary | ICD-10-CM

## 2017-08-09 DIAGNOSIS — G8929 Other chronic pain: Secondary | ICD-10-CM

## 2017-08-09 NOTE — Therapy (Addendum)
Derrick Foster, [place], [address] Phone: [phone]   Fax:  [phone]  Physical Therapy Treatment/Disharge Note  Patient Details  Name: Derrick Foster MRN: [id number] Date of Birth: April 19, 1961 Referring Provider: Ulanda Edison [facility] PT, Nolene Bernheim MD  Encounter Date: 08/09/2017      PT End of Session - 08/09/17 0848    Visit Number 4   Number of Visits 13   Date for PT Re-Evaluation 09/05/17   Authorization Type VA    Authorization Time Period 09-05-17   PT Start Time 0845   PT Stop Time 0945   PT Time Calculation (min) 60 min      Past Medical History:  Diagnosis Date  . Hypertension     No past surgical history on file.  There were no vitals filed for this visit.      Subjective Assessment - 08/09/17 0847    Subjective No pain today or since last time. Shoulders are feeling better and have more Range.    Currently in Pain? No/denies            [facility] PT Assessment - 08/09/17 0001      Observation/Other Assessments   Focus on Therapeutic Outcomes (FOTO)  10% limited improved from 37 % limited      AROM   Overall AROM  Within functional limits for tasks performed     PROM   Right Shoulder Internal Rotation 75 Degrees   Right Shoulder External Rotation 95 Degrees     Strength   Right Shoulder External Rotation 5/5  no pain.                      [facility] Adult PT Treatment/Exercise - 08/09/17 0001      Shoulder Exercises: Standing   Row 20 reps   Theraband Level (Shoulder Row) Level 3 (Green)   Other Standing Exercises Performed supine scap stab series in standing with red band      Shoulder Exercises: ROM/Strengthening   UBE (Upper Arm Bike) 2 min forward and 2 min backward level 2 with VC for posture     Shoulder Exercises: Stretch   Corner Stretch 3 reps;30 seconds     Moist Heat Therapy   Number Minutes Moist Heat 15 Minutes   Moist Heat Location  Shoulder     Manual Therapy   Manual Therapy Passive ROM   Passive ROM all planes                   PT Short Term Goals - 07/25/17 0839      PT SHORT TERM GOAL #1   Title STG= LTG           PT Long Term Goals - 08/09/17 0958      PT LONG TERM GOAL #1   Title Pt will be independent with advanced HEP    Status Achieved     PT LONG TERM GOAL #2   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   Baseline no problems at work anymore     Time 6   Period Weeks   Status Achieved               Plan - 08/09/17 0929    Clinical Impression Statement Pt reports abolishment of pain. Progressed to standing scap stabilization without pain. shoulder ROM WNL all planes without pain. ER strength 5/5 without pain. FOTO 10 % limited, improved from 37% limited. All goals met. Pt may call and cancel last visit if doing well with exercise progression.    PT Next Visit Plan  standing scapular posture and moving into push ups. go over posture/body mechanics ADL   PT Home Exercise Plan sitting and standning posture, isometric shld extension into wall , supine scapular stabilizers, corner stretch green t band   Consulted and Agree with Plan of Care Patient      Patient will benefit from skilled therapeutic intervention in order to improve the following deficits and impairments:  Pain, Postural  dysfunction, Improper body mechanics, Impaired UE functional use, Decreased strength, Decreased range of motion  Visit Diagnosis: Chronic right shoulder pain  Abnormal posture  Stiffness of right shoulder, not elsewhere classified     Problem List There are no active problems to display for this patient.   Dorene Ar, [place] 08/09/2017, 10:02 AM  [place], [place], [address] Phone: [phone]   Fax:  [phone]   G code  Carrying and Lifting 08-09-17 Goal CI Discharge CI  Name: Derrick Foster MRN: [id number] Date of Birth: [birth year]   PHYSICAL THERAPY DISCHARGE SUMMARY  Visits from Start of Care: 4  Current functional level related to goals / functional outcomes: As above   FOTO  10% liimitation from 37% limitation on eval   Remaining deficits: Pt called to cancel all appt due to doing much better   Education / Equipment: HEP and T band Plan: Patient agrees to discharge.  Patient goals were met. Patient is being discharged due to meeting the stated rehab goals.  ?????    and pt calling to cancel all appt due to feeling better and returning to work and working out at work [place], [place] Certified Exercise Expert for the Aging Adult  08/15/17 9:42 AM Phone: [phone] Fax: [phone]

## 2017-08-13 ENCOUNTER — Ambulatory Visit: Payer: Non-veteran care | Admitting: Physical Therapy

## 2017-08-15 ENCOUNTER — Encounter: Payer: PRIVATE HEALTH INSURANCE | Admitting: Physical Therapy

## 2019-05-05 ENCOUNTER — Ambulatory Visit (HOSPITAL_COMMUNITY)
Admission: EM | Admit: 2019-05-05 | Discharge: 2019-05-05 | Disposition: A | Discharge status: Home / Self Care | Payer: Non-veteran care | Attending: Emergency Medicine | Admitting: Emergency Medicine

## 2019-05-05 ENCOUNTER — Encounter (HOSPITAL_COMMUNITY): Payer: Self-pay

## 2019-05-05 ENCOUNTER — Other Ambulatory Visit: Payer: Self-pay

## 2019-05-05 DIAGNOSIS — I10 Essential (primary) hypertension: Secondary | ICD-10-CM

## 2019-05-05 DIAGNOSIS — T148XXA Other injury of unspecified body region, initial encounter: Secondary | ICD-10-CM | POA: Diagnosis not present

## 2019-05-05 MED ORDER — CYCLOBENZAPRINE HCL 10 MG PO TABS: 10.0000 mg | ORAL_TABLET | Freq: Two times a day (BID) | ORAL | 0 refills | Status: DC | PRN

## 2019-05-05 NOTE — Discharge Instructions (Addendum)
Use heating pad and muscle relaxer as needed for spasm/tightness.

## 2019-05-05 NOTE — ED Provider Notes (Signed)
[facility]    CSN: [id number] Arrival date & time: 05/05/19  1041     History   Chief Complaint Chief Complaint  Patient presents with   Back Pain    HPI Pricilla Larssonlbert L Boman is a 58 y.o. male with history of hypertension presenting for left low back pain..  Patient states that he was in the shower yesterday, was bending over and felt his low back pull.  Patient denies pop/snap/tear sensation.  Since then he has had tightness in his back.  Patient denies radiation of pain, abdominal pain, pelvic pain, urinary or fecal incontinence, lower extremities paresthesias, numbness, or difficulty ambulating.  Patient states "I just feels real sore and tight ".  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 Note, patient is hypertensive in office today: States he has not yet taken his blood pressure medications.  States that he takes this typically at noon around lunchtime.  Denies headache, change in vision, chest pain, shortness of breath, abdominal pain.  Past Medical History:  Diagnosis Date   Hypertension     There are no active problems to display for this patient.   History reviewed. No pertinent surgical history.     Home Medications    Prior to Admission medications   Medication Sig Start Date End Date Taking? Authorizing Provider  amLODipine (NORVASC) 10 MG tablet Take 10 mg by mouth daily.    [provider]  aspirin EC 81 MG tablet Take 81 mg by mouth daily.    [provider]  cyclobenzaprine (FLEXERIL) 10 MG tablet Take 1 tablet (10 mg total) by mouth 2 (two) times daily as needed for muscle spasms. 05/05/19   Hall-Potvin, [place], PA-C  hydrochlorothiazide (HYDRODIURIL) 25 MG tablet Take 25 mg by mouth daily.    [provider]  ibuprofen (ADVIL,MOTRIN) 400 MG tablet Take 1 tablet (400 mg total) by mouth every 6 (six) hours as  needed. Patient taking differently: Take 400 mg by mouth every 6 (six) hours as needed for moderate pain.  10/15/15   Joycie Peekartner, Benjamin, PA-C  Multiple Vitamin (MULTIVITAMIN WITH MINERALS) TABS Take 1 tablet by mouth daily.    [provider]  Probiotic Product (PROBIOTIC DAILY PO) Take 1 capsule by mouth daily.     [provider]    Family History No family history on file.  Social History Social History   Tobacco Use   Smoking status: Current Every Day Smoker    Packs/day: 0.50    Types: Cigarettes   Smokeless tobacco: Current User  Substance Use Topics   Alcohol use: Yes    Alcohol/week: 1.0 standard drinks    Types: 1 Shots of liquor per week    Comment: every day   Drug use: No     Allergies   Patient has no known allergies.   Review of Systems As per HPI   Physical Exam Triage Vital Signs ED Triage Vitals [05/05/19 1138]  Enc Vitals Group     BP (!) 157/93     Pulse Rate 90     Resp 18     Temp 98.8 F (37.1 C)     Temp src      SpO2 100 %     Weight 250 lb (113.4 kg)     Height      Head Circumference      Peak  Flow      Pain Score      Pain Loc      Pain Edu?      Excl. in [place]?    No data found.  Updated Vital Signs BP (!) 157/93 (BP Location: Right Arm)    Pulse 90    Temp 98.8 F (37.1 C)    Resp 18    Wt 250 lb (113.4 kg)    SpO2 100%    BMI 33.91 kg/m   Visual Acuity Right Eye Distance:   Left Eye Distance:   Bilateral Distance:    Right Eye Near:   Left Eye Near:    Bilateral Near:     Physical Exam Constitutional:      General: He is not in acute distress. HENT:     Head: Normocephalic and atraumatic.  Eyes:     General: No scleral icterus.    Pupils: Pupils are equal, round, and reactive to light.  Cardiovascular:     Rate and Rhythm: Normal rate.  Pulmonary:     Effort: Pulmonary effort is normal.  Musculoskeletal:     Comments: Decreased range of lumbar spine second to tightness.  No evidence of  scoliosis.  No spinal or paraspinal tenderness.  Left lower back with mild swelling, tenderness, hyper tonicity.  No spasm appreciated  Skin:    Coloration: Skin is not jaundiced or pale.  Neurological:     Mental Status: He is alert and oriented to person, place, and time.      UC Treatments / Results  Labs (all labs ordered are listed, but only abnormal results are displayed) Labs Reviewed - No data to display  EKG None  Radiology No results found.  Procedures Procedures (including critical care time)  Medications Ordered in UC Medications - No data to display  Initial Impression / Assessment and Plan / UC Course  I have reviewed the triage vital signs and the nursing notes.  Pertinent labs & imaging results that were available during my care of the patient were reviewed by me and considered in my medical decision making (see chart for details).     58 year old male with history of hypertension presenting for left low back pain status post "catching "in the shower.  Physical exam reassuring for musculoskeletal process, low concern for bulging disc.  Will try muscle relaxer as needed in conjunction with OTC NSAIDs, heat therapy, massage.  Work note provided including avoidance of heavy lifting for 1 week.  Return precautions discussed, patient verbalized understanding. Final Clinical Impressions(s) / UC Diagnoses   Final diagnoses:  Muscle strain     Discharge Instructions     Use heating pad and muscle relaxer as needed for spasm/tightness.    ED Prescriptions    Medication Sig Dispense Auth. Provider   cyclobenzaprine (FLEXERIL) 10 MG tablet Take 1 tablet (10 mg total) by mouth 2 (two) times daily as needed for muscle spasms. 20 tablet Hall-Potvin, [place], PA-C     Controlled Substance Prescriptions [place] Controlled Substance Registry consulted? Not Applicable   Quincy Sheehan, Hershal Coria 05/05/19 2125

## 2019-05-05 NOTE — ED Triage Notes (Signed)
Pt states he pulled a muscle in his back yesterday while he was in the shower he moved the wrong way.  Pt states he needs a work note.

## 2019-06-09 ENCOUNTER — Ambulatory Visit (HOSPITAL_COMMUNITY)
Admission: EM | Admit: 2019-06-09 | Discharge: 2019-06-09 | Disposition: A | Discharge status: Home / Self Care | Payer: Non-veteran care | Attending: Family Medicine | Admitting: Family Medicine

## 2019-06-09 ENCOUNTER — Encounter (HOSPITAL_COMMUNITY): Payer: Self-pay | Admitting: Emergency Medicine

## 2019-06-09 ENCOUNTER — Other Ambulatory Visit: Payer: Self-pay

## 2019-06-09 DIAGNOSIS — M25562 Pain in left knee: Secondary | ICD-10-CM | POA: Diagnosis not present

## 2019-06-09 MED ORDER — PREDNISONE 50 MG PO TABS: 50.0000 mg | ORAL_TABLET | Freq: Every day | ORAL | 0 refills | Status: AC

## 2019-06-09 MED ORDER — NAPROXEN 500 MG PO TABS: 500.0000 mg | ORAL_TABLET | Freq: Two times a day (BID) | ORAL | 0 refills | Status: DC

## 2019-06-09 NOTE — ED Triage Notes (Signed)
Patient presents to Urgent Care with complaints of left knee pain since about 3-4 days ago. Patient reports he does not know how he hurt it.

## 2019-06-09 NOTE — ED Provider Notes (Signed)
[place]    CSN: [id number] Arrival date & time: 06/09/19  2706      History   Chief Complaint Chief Complaint  Patient presents with  . Knee Injury    HPI Derrick Foster is a 58 y.o. male history of hypertension presenting today for evaluation of left knee pain.  Patient states that over the past 3 to 4 days he has developed knee pain to the inner aspect of his left knee.  He denies any injury or increase in activity.  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  Denies history of issues with his knee.  Denies any falls.  He is used ibuprofen 800 mg once daily as well as Bayer back and body.  Is on a baby aspirin, but denies anticoagulants.  Denies history of diabetes.  HPI  Past Medical History:  Diagnosis Date  . Hypertension     There are no active problems to display for this patient.   History reviewed. No pertinent surgical history.     Home Medications    Prior to Admission medications   Medication Sig Start Date End Date Taking? Authorizing Provider  amLODipine (NORVASC) 10 MG tablet Take 10 mg by mouth daily.    [provider]  aspirin EC 81 MG tablet Take 81 mg by mouth daily.    [provider]  cyclobenzaprine (FLEXERIL) 10 MG tablet Take 1 tablet (10 mg total) by mouth 2 (two) times daily as needed for muscle spasms. 05/05/19   Hall-Potvin, [place], PA-C  hydrochlorothiazide (HYDRODIURIL) 25 MG tablet Take 25 mg by mouth daily.    [provider]  ibuprofen (ADVIL,MOTRIN) 400 MG tablet Take 1 tablet (400 mg total) by mouth every 6 (six) hours as needed. Patient taking differently: Take 400 mg by mouth every 6 (six) hours as needed for moderate pain.  10/15/15   Comer Locket, PA-C  Multiple Vitamin (MULTIVITAMIN WITH MINERALS) TABS Take 1 tablet by mouth daily.    [provider]  naproxen (NAPROSYN) 500 MG tablet Take 1  tablet (500 mg total) by mouth 2 (two) times daily. 06/09/19   Arias Weinert C, PA-C  predniSONE (DELTASONE) 50 MG tablet Take 1 tablet (50 mg total) by mouth daily for 5 days. 06/09/19 06/14/19  Ephriam Turman C, PA-C  Probiotic Product (PROBIOTIC DAILY PO) Take 1 capsule by mouth daily.     [provider]    Family History Family History  Problem Relation Age of Onset  . Cancer Mother   . Heart failure Father     Social History Social History   Tobacco Use  . Smoking status: Current Every Day Smoker    Packs/day: 0.50    Types: Cigarettes  . Smokeless tobacco: Current User  Substance Use Topics  . Alcohol use: Yes    Alcohol/week: 1.0 standard drinks    Types: 1 Shots of liquor per week    Comment: every day  . Drug use: No     Allergies   Patient has no known allergies.   Review of Systems Review of Systems  Constitutional: Negative for fatigue and fever.  Eyes: Negative for redness, itching and visual disturbance.  Respiratory: Negative for shortness of breath.   Cardiovascular: Negative for chest pain and leg swelling.  Gastrointestinal: Negative for nausea and vomiting.  Musculoskeletal: Positive for arthralgias. Negative for myalgias.  Skin:  Negative for color change, rash and wound.  Neurological: Negative for dizziness, syncope, weakness, light-headedness and headaches.     Physical Exam Triage Vital Signs ED Triage Vitals  Enc Vitals Group     BP 06/09/19 0909 (!) 154/96     Pulse Rate 06/09/19 0909 65     Resp 06/09/19 0909 16     Temp 06/09/19 0909 98.1 F (36.7 C)     Temp Source 06/09/19 0909 Oral     SpO2 06/09/19 0909 97 %     Weight --      Height --      Head Circumference --      Peak Flow --      Pain Score 06/09/19 0932 5     Pain Loc --      Pain Edu? --      Excl. in [place]? --    No data found.  Updated Vital Signs BP (!) 154/96 (BP Location: Right Arm)   Pulse 65   Temp 98.1 F (36.7 C) (Oral)   Resp 16   SpO2  97%   Visual Acuity Right Eye Distance:   Left Eye Distance:   Bilateral Distance:    Right Eye Near:   Left Eye Near:    Bilateral Near:     Physical Exam Vitals signs and nursing note reviewed.  Constitutional:      Appearance: He is well-developed.     Comments: No acute distress  HENT:     Head: Normocephalic and atraumatic.     Nose: Nose normal.  Eyes:     Conjunctiva/sclera: Conjunctivae normal.  Neck:     Musculoskeletal: Neck supple.  Cardiovascular:     Rate and Rhythm: Normal rate.  Pulmonary:     Effort: Pulmonary effort is normal. No respiratory distress.  Abdominal:     General: There is no distension.  Musculoskeletal: Normal range of motion.     Comments: Left knee: No obvious swelling or deformity, no discoloration or erythema, nontender to palpation over patella and lateral joint line, tenderness to palpation over medial joint line, nontender in popliteal area or into calf No varus or valgus laxity appreciated, patient experienced pain medially with McMurray's, but no popping or catching palpated with maneuver.  Negative Lockman's  Skin:    General: Skin is warm and dry.  Neurological:     Mental Status: He is alert and oriented to person, place, and time.      UC Treatments / Results  Labs (all labs ordered are listed, but only abnormal results are displayed) Labs Reviewed - No data to display  EKG   Radiology No results found.  Procedures Procedures (including critical care time)  Medications Ordered in UC Medications - No data to display  Initial Impression / Assessment and Plan / UC Course  I have reviewed the triage vital signs and the nursing notes.  Pertinent labs & imaging results that were available during my care of the patient were reviewed by me and considered in my medical decision making (see chart for details).     3 days of knee pain without mechanism of injury, most likely inflammatory.  Do not suspect underlying acute  bony abnormality.  Discussed pros and cons of imaging, opted to defer imaging care to shared decision making.  Will provide Ace wrap, short course of prednisone followed by NSAIDs.  Ice and elevate.  2 days off from work in order to rest.  Discussed possible underlying meniscal injury  given location and pain with McMurray's.  Recommended following up with primary care orthopedics if symptoms persisting.  Do not suspect cellulitis, gout at this time.  No overlying change in skin color.Discussed strict return precautions. Patient verbalized understanding and is agreeable with plan.  Final Clinical Impressions(s) / UC Diagnoses   Final diagnoses:  Acute pain of left knee     Discharge Instructions     Pain most likely inflammation, possible underlying arthritis or possible meniscus tear if symptoms persist Ice and elevate leg when at home Begin Prednisone daily for 5 days, take with food and in the morning After finishing prednisone- You may take up to 800 mg Ibuprofen every 8 hours with food. You may supplement Ibuprofen with Tylenol [phone] mg every 8 hours.  OR  Naprosyn twice daily with food  Please follow up with primary care or orthopedics if symptoms still persisting   ED Prescriptions    Medication Sig Dispense Auth. Provider   predniSONE (DELTASONE) 50 MG tablet Take 1 tablet (50 mg total) by mouth daily for 5 days. 5 tablet Rommie Dunn C, PA-C   naproxen (NAPROSYN) 500 MG tablet Take 1 tablet (500 mg total) by mouth 2 (two) times daily. 30 tablet Shanicka Oldenkamp, [place]ie C, PA-C     Controlled Substance Prescriptions [place] Controlled Substance Registry consulted? Not Applicable   Lew DawesWieters, Kamarrion Stfort C, [place] JerseyPA-C 06/09/19 [phone]

## 2019-06-09 NOTE — Discharge Instructions (Addendum)
Pain most likely inflammation, possible underlying arthritis or possible meniscus tear if symptoms persist Ice and elevate leg when at home Begin Prednisone daily for 5 days, take with food and in the morning After finishing prednisone- You may take up to 800 mg Ibuprofen every 8 hours with food. You may supplement Ibuprofen with Tylenol [phone] mg every 8 hours.  OR  Naprosyn twice daily with food  Please follow up with primary care or orthopedics if symptoms still persisting

## 2019-08-13 ENCOUNTER — Emergency Department (HOSPITAL_COMMUNITY)
Admission: EM | Admit: 2019-08-13 | Discharge: 2019-08-13 | Disposition: A | Discharge status: Home / Self Care | Payer: No Typology Code available for payment source | Attending: Emergency Medicine | Admitting: Emergency Medicine

## 2019-08-13 ENCOUNTER — Encounter (HOSPITAL_COMMUNITY): Payer: Self-pay | Admitting: Emergency Medicine

## 2019-08-13 ENCOUNTER — Other Ambulatory Visit: Payer: Self-pay

## 2019-08-13 DIAGNOSIS — I10 Essential (primary) hypertension: Secondary | ICD-10-CM | POA: Insufficient documentation

## 2019-08-13 DIAGNOSIS — Z79899 Other long term (current) drug therapy: Secondary | ICD-10-CM | POA: Insufficient documentation

## 2019-08-13 DIAGNOSIS — F17210 Nicotine dependence, cigarettes, uncomplicated: Secondary | ICD-10-CM | POA: Diagnosis not present

## 2019-08-13 DIAGNOSIS — Z7982 Long term (current) use of aspirin: Secondary | ICD-10-CM | POA: Insufficient documentation

## 2019-08-13 DIAGNOSIS — M436 Torticollis: Secondary | ICD-10-CM | POA: Diagnosis not present

## 2019-08-13 DIAGNOSIS — M542 Cervicalgia: Secondary | ICD-10-CM | POA: Diagnosis present

## 2019-08-13 MED ORDER — DICLOFENAC SODIUM 1 % TD GEL: 2.0000 g | Freq: Four times a day (QID) | TRANSDERMAL | 0 refills | Status: DC

## 2019-08-13 MED ORDER — KETOROLAC TROMETHAMINE 30 MG/ML IJ SOLN
30.0000 mg | Freq: Once | INTRAMUSCULAR | Status: AC
  Administered 2019-08-13: 30 mg via INTRAMUSCULAR
  Filled 2019-08-13: qty 1

## 2019-08-13 MED ORDER — CYCLOBENZAPRINE HCL 10 MG PO TABS: 10.0000 mg | ORAL_TABLET | Freq: Two times a day (BID) | ORAL | 0 refills | Status: DC | PRN

## 2019-08-13 NOTE — ED Triage Notes (Addendum)
C/o posterior neck pain and unable to turn head since at work on Monday.  Denies injury.  Pain worse with movement.  Took Bayer back medicine without relief.

## 2019-08-13 NOTE — Discharge Instructions (Signed)
Wear neck brace for comfort.  Apply voltaren gel to affected area as needed for pain, use flexeril as needed for muscle relaxant.  Follow up with orthopedist for further care.

## 2019-08-13 NOTE — ED Provider Notes (Signed)
[facility] Provider Note   CSN: [id number] Arrival date & time: 08/13/19  0139     History   Chief Complaint Chief Complaint  Patient presents with  . Neck Pain    HPI Derrick Foster is a 58 y.o. male.     The history is provided by the patient. No language interpreter was used.  Neck Pain    58 year old male with history of hypertension, tobacco use, presenting for evaluation of neck pain.  Patient report for the past 3 days he has been experiencing neck stiffness and pain.  He described as a sharp tightness sensation, to the back of his neck favors the right more than the left.  Pain increased with neck movement.  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  He has persistent, causing him to miss work yesterday.  Rates pain as 8 out of 10.  No associated headache, arm weakness, arm numbness, pain chest pain, trouble breathing, dental pain, or rash.  Denies any recent injury.  Denies any heavy lifting more than usual.  Denies any change in his pillow or mattress.  No associated fever or URI symptoms.  He has tried over-the-counter medication including heat, ice, ibuprofen, icy hot without adequate relief.  He denies any lightheadedness or dizziness.  Past Medical History:  Diagnosis Date  . Hypertension     There are no active problems to display for this patient.   History reviewed. No pertinent surgical history.      Home Medications    Prior to Admission medications   Medication Sig Start Date End Date Taking? Authorizing Provider  amLODipine (NORVASC) 10 MG tablet Take 10 mg by mouth daily.    [provider]  aspirin EC 81 MG tablet Take 81 mg by mouth daily.    [provider]  cyclobenzaprine (FLEXERIL) 10 MG tablet Take 1 tablet (10 mg total) by mouth 2 (two) times daily as needed for muscle spasms. 05/05/19   Hall-Potvin, [place], PA-C  hydrochlorothiazide  (HYDRODIURIL) 25 MG tablet Take 25 mg by mouth daily.    [provider]  ibuprofen (ADVIL,MOTRIN) 400 MG tablet Take 1 tablet (400 mg total) by mouth every 6 (six) hours as needed. Patient taking differently: Take 400 mg by mouth every 6 (six) hours as needed for moderate pain.  10/15/15   Comer Locket, PA-C  Multiple Vitamin (MULTIVITAMIN WITH MINERALS) TABS Take 1 tablet by mouth daily.    [provider]  naproxen (NAPROSYN) 500 MG tablet Take 1 tablet (500 mg total) by mouth 2 (two) times daily. 06/09/19   Wieters, Hallie C, PA-C  Probiotic Product (PROBIOTIC DAILY PO) Take 1 capsule by mouth daily.     [provider]    Family History Family History  Problem Relation Age of Onset  . Cancer Mother   . Heart failure Father     Social History Social History   Tobacco Use  . Smoking status: Current Every Day Smoker    Packs/day: 0.50    Types: Cigarettes  . Smokeless tobacco: Current User  Substance Use Topics  . Alcohol use: Yes    Alcohol/week: 1.0 standard drinks    Types: 1 Shots of liquor per week    Comment: every day  . Drug use: No     Allergies   Patient has no known allergies.   Review of Systems Review of Systems  Musculoskeletal:  Positive for neck pain.  All other systems reviewed and are negative.    Physical Exam Updated Vital Signs BP (!) 176/119 (BP Location: Right Arm)   Pulse 75   Temp 97.8 F (36.6 C) (Oral)   Resp 20   Ht 6' (1.829 m)   Wt 117.9 kg   SpO2 97%   BMI 35.26 kg/m   Physical Exam Vitals signs and nursing note reviewed.  Constitutional:      General: He is not in acute distress.    Appearance: He is well-developed.  HENT:     Head: Atraumatic.  Eyes:     Conjunctiva/sclera: Conjunctivae normal.  Neck:     Musculoskeletal: Neck rigidity (Tenderness to cervical and paracervical spinal muscle bilaterally right greater than left on palpation without any crepitus, step-off, or overlying skin  changes, able to flex and extend the neck but increasing pain with lateral movement) present.     Vascular: No carotid bruit.  Cardiovascular:     Rate and Rhythm: Normal rate and regular rhythm.     Pulses: Normal pulses.  Pulmonary:     Effort: Pulmonary effort is normal.     Breath sounds: Normal breath sounds.  Abdominal:     Palpations: Abdomen is soft.  Musculoskeletal:     Comments: 5 out of 5 strength to upper extremities bilaterally with intact radial pulses.  Normal grip strength.  Lymphadenopathy:     Cervical: No cervical adenopathy.  Skin:    Findings: No rash.  Neurological:     Mental Status: He is alert and oriented to person, place, and time.  Psychiatric:        Mood and Affect: Mood normal.      ED Treatments / Results  Labs (all labs ordered are listed, but only abnormal results are displayed) Labs Reviewed - No data to display  EKG None  Radiology No results found.  Procedures Procedures (including critical care time)  Medications Ordered in ED Medications - No data to display   Initial Impression / Assessment and Plan / ED Course  I have reviewed the triage vital signs and the nursing notes.  Pertinent labs & imaging results that were available during my care of the patient were reviewed by me and considered in my medical decision making (see chart for details).        BP (!) 176/119 (BP Location: Right Arm)   Pulse 75   Temp 97.8 F (36.6 C) (Oral)   Resp 20   Ht 6' (1.829 m)   Wt 117.9 kg   SpO2 97%   BMI 35.26 kg/m   The patient was noted to be hypertensive today in the emergency department. I have spoken with the patient regarding hypertension and the need for improved management. I instructed the patient to followup with the Primary care doctor within 4 days to improve the management of the patient's hypertension. I also counseled the patient regarding the signs and symptoms which would require an emergent visit to an emergency  department for hypertensive urgency and/or hypertensive emergency. The patient understood the need for improved hypertensive management.   Final Clinical Impressions(s) / ED Diagnoses   Final diagnoses:  Torticollis    ED Discharge Orders         Ordered    cyclobenzaprine (FLEXERIL) 10 MG tablet  2 times daily PRN     08/13/19 0936    diclofenac sodium (VOLTAREN) 1 % GEL  4 times daily     08/13/19  8938         8:59 AM Patient here with pain and stiffness to his neck for the past 3 days.  Does not have any focal neuro deficit on exam and no carotid bruit to suggest vascular dissection.  No fever or true nuchal rigidity to suggest meningitis.  No trouble swallowing and no overlying skin changes.  He has increasing pain with lateral neck movement suggestive of torticollis.  Will provide cervical soft collar, anti-inflammatory medication as well as muscle relaxant.  Patient given strict return precaution.  Orthopedic referral given as needed.   Fayrene Helper, PA-C 08/13/19 1017    Margarita Grizzle, MD 08/13/19 [phone]

## 2019-08-18 ENCOUNTER — Encounter (HOSPITAL_COMMUNITY): Payer: Self-pay | Admitting: Emergency Medicine

## 2019-08-18 ENCOUNTER — Other Ambulatory Visit: Payer: Self-pay

## 2019-08-18 ENCOUNTER — Emergency Department (HOSPITAL_COMMUNITY)
Admission: EM | Admit: 2019-08-18 | Discharge: 2019-08-18 | Disposition: A | Discharge status: Home / Self Care | Payer: No Typology Code available for payment source | Attending: Pediatric Emergency Medicine | Admitting: Pediatric Emergency Medicine

## 2019-08-18 DIAGNOSIS — S161XXD Strain of muscle, fascia and tendon at neck level, subsequent encounter: Secondary | ICD-10-CM | POA: Insufficient documentation

## 2019-08-18 DIAGNOSIS — Z7982 Long term (current) use of aspirin: Secondary | ICD-10-CM | POA: Insufficient documentation

## 2019-08-18 DIAGNOSIS — Z79899 Other long term (current) drug therapy: Secondary | ICD-10-CM | POA: Diagnosis not present

## 2019-08-18 DIAGNOSIS — X58XXXD Exposure to other specified factors, subsequent encounter: Secondary | ICD-10-CM | POA: Insufficient documentation

## 2019-08-18 DIAGNOSIS — F17210 Nicotine dependence, cigarettes, uncomplicated: Secondary | ICD-10-CM | POA: Diagnosis not present

## 2019-08-18 DIAGNOSIS — Y9289 Other specified places as the place of occurrence of the external cause: Secondary | ICD-10-CM | POA: Diagnosis not present

## 2019-08-18 DIAGNOSIS — I10 Essential (primary) hypertension: Secondary | ICD-10-CM

## 2019-08-18 DIAGNOSIS — Y990 Civilian activity done for income or pay: Secondary | ICD-10-CM | POA: Insufficient documentation

## 2019-08-18 DIAGNOSIS — Y939 Activity, unspecified: Secondary | ICD-10-CM | POA: Diagnosis not present

## 2019-08-18 DIAGNOSIS — S161XXA Strain of muscle, fascia and tendon at neck level, initial encounter: Secondary | ICD-10-CM

## 2019-08-18 NOTE — ED Triage Notes (Signed)
Pt was seen here on 9/24 for a work related neck injury where he was diagnosed with a muscle strain and placed in a soft collar and muscle relaxants with follow up with ortho.   Pt has returned requesting a note to be out of work longer. Pt has had no follow up yet or seen his pcp. Pt has no new sxs actually states he is feeling better.

## 2019-08-18 NOTE — ED Provider Notes (Signed)
[facility] Provider Note   CSN: [id number] Arrival date & time: 08/18/19  0815     History   Chief Complaint Chief Complaint  Patient presents with  . Neck Injury    HPI Derrick Foster is a 58 y.o. male.     HPI   58 year old male with history of hypertension who comes to [place] on day 5 of neck injury.  Patient was seen on day 1 of injury with lateral neck pain likely muscle strain treated with Flexeril and compresses.  Patient reports improving movement of his neck and less pain but still uncomfortable turning his head so uncomfortable with going to work and driving so presents for evaluation.  No fevers cough or other sick symptoms.  Past Medical History:  Diagnosis Date  . Hypertension     There are no active problems to display for this patient.   History reviewed. No pertinent surgical history.      Home Medications    Prior to Admission medications   Medication Sig Start Date End Date Taking? Authorizing Provider  amLODipine (NORVASC) 10 MG tablet Take 10 mg by mouth daily.    [provider]  aspirin EC 81 MG tablet Take 81 mg by mouth daily.    [provider]  cyclobenzaprine (FLEXERIL) 10 MG tablet Take 1 tablet (10 mg total) by mouth 2 (two) times daily as needed for muscle spasms. 08/13/19   Domenic Moras, PA-C  diclofenac sodium (VOLTAREN) 1 % GEL Apply 2 g topically 4 (four) times daily. 08/13/19   Domenic Moras, PA-C  hydrochlorothiazide (HYDRODIURIL) 25 MG tablet Take 25 mg by mouth daily.    [provider]  ibuprofen (ADVIL,MOTRIN) 400 MG tablet Take 1 tablet (400 mg total) by mouth every 6 (six) hours as needed. Patient taking differently: Take 400 mg by mouth every 6 (six) hours as needed for moderate pain.  10/15/15   Comer Locket, PA-C  Multiple Vitamin (MULTIVITAMIN WITH MINERALS) TABS Take 1 tablet by mouth daily.    [provider]  naproxen (NAPROSYN) 500 MG tablet Take 1  tablet (500 mg total) by mouth 2 (two) times daily. 06/09/19   Wieters, Hallie C, PA-C  Probiotic Product (PROBIOTIC DAILY PO) Take 1 capsule by mouth daily.     [provider]    Family History Family History  Problem Relation Age of Onset  . Cancer Mother   . Heart failure Father     Social History Social History   Tobacco Use  . Smoking status: Current Every Day Smoker    Packs/day: 0.50    Types: Cigarettes  . Smokeless tobacco: Current User  Substance Use Topics  . Alcohol use: Yes    Alcohol/week: 1.0 standard drinks    Types: 1 Shots of liquor per week    Comment: every day  . Drug use: No     Allergies   Patient has no known allergies.   Review of Systems Review of Systems  Constitutional: Negative for fever.  HENT: Negative for congestion, rhinorrhea, sinus pressure, sinus pain and sore throat.   Eyes: Negative for visual disturbance.  Respiratory: Negative for cough and shortness of breath.   Cardiovascular: Negative for chest pain.  Gastrointestinal: Negative for abdominal pain and vomiting.  Musculoskeletal: Positive for neck pain and neck stiffness. Negative for arthralgias, gait problem and myalgias.  Skin: Negative for rash and wound.  Neurological: Negative for tremors, facial asymmetry, weakness and numbness.  All  other systems reviewed and are negative.    Physical Exam Updated Vital Signs BP (!) 150/103 (BP Location: Right Arm)   Pulse 94   Temp 98.2 F (36.8 C) (Oral)   Resp 16   SpO2 99%   Physical Exam Vitals signs and nursing note reviewed.  Constitutional:      Appearance: He is well-developed.  HENT:     Head: Normocephalic and atraumatic.     Right Ear: Tympanic membrane normal.     Left Ear: Tympanic membrane normal.     Nose: No congestion or rhinorrhea.  Eyes:     Extraocular Movements: Extraocular movements intact.     Conjunctiva/sclera: Conjunctivae normal.     Pupils: Pupils are equal, round, and reactive to  light.  Neck:     Musculoskeletal: Neck supple. Muscular tenderness (No midline neck tenderness left lateral tenderness worse than right with limitation to flexion extension and rotation secondary to pain) present.     Vascular: No carotid bruit.  Cardiovascular:     Rate and Rhythm: Normal rate and regular rhythm.     Heart sounds: No murmur.  Pulmonary:     Effort: Pulmonary effort is normal. No respiratory distress.     Breath sounds: Normal breath sounds.  Abdominal:     Palpations: Abdomen is soft.     Tenderness: There is no abdominal tenderness.  Lymphadenopathy:     Cervical: No cervical adenopathy.  Skin:    General: Skin is warm and dry.     Capillary Refill: Capillary refill takes less than 2 seconds.  Neurological:     General: No focal deficit present.     Mental Status: He is alert.     Cranial Nerves: No cranial nerve deficit.     Sensory: No sensory deficit.     Motor: No weakness.     Gait: Gait normal.     Deep Tendon Reflexes: Reflexes normal.      ED Treatments / Results  Labs (all labs ordered are listed, but only abnormal results are displayed) Labs Reviewed - No data to display  EKG None  Radiology No results found.  Procedures Procedures (including critical care time)  Medications Ordered in ED Medications - No data to display   Initial Impression / Assessment and Plan / ED Course  I have reviewed the triage vital signs and the nursing notes.  Pertinent labs & imaging results that were available during my care of the patient were reviewed by me and considered in my medical decision making (see chart for details).        Patient is a 58 year old male here with neck strain.  Improving pain with muscle relaxers at home and improvement of range of motion although without resolve presents for reevaluation.  Here patient is hypertensive otherwise hemodynamically appropriate and stable on room air with normal saturations.  Afebrile.  In no  distress on exam.  Lungs clear with good air entry.  Normal cardiac exam.  Head and neck exam notable for atraumatic findings with normal TMs bilaterally no congestion normal oropharynx tenderness over left lateral neck worse than right with no midline tenderness.  Limitation to range of motion secondary to pain.  No carotid bruit.  No adenopathy.  Normal thyroid.  Neuro exam without focal deficit with normal sensation normal strength of upper and lower extremities and normal gait here.  Instructed patient on importance of continued symptomatic management and close outpatient PCP and orthopedic follow-up.  Patient feels comfortable with  this plan his pain is improving.  Work note provided.  Also provided instruction on continued hypertensive management as this is patient's second hypertensive evaluation in the past week.  Currently on antihypertensive management per PCP and instructed on importance of follow-up for reevaluation and potential medical optimization.  Patient voiced understanding and patient appropriate for discharge with close return precautions.  Final Clinical Impressions(s) / ED Diagnoses   Final diagnoses:  Strain of neck muscle, initial encounter  Essential hypertension    ED Discharge Orders    None       Skylor Hughson, Wyvonnia Duskyyan J, MD 08/18/19 [phone]

## 2019-08-18 NOTE — ED Notes (Signed)
Pt. alert & interactive during discharge; pt. ambulatory to exit 

## 2019-08-18 NOTE — ED Notes (Signed)
MD at bedside. 

## 2020-08-18 ENCOUNTER — Other Ambulatory Visit: Payer: Self-pay

## 2020-08-18 ENCOUNTER — Ambulatory Visit (HOSPITAL_COMMUNITY)
Admission: EM | Admit: 2020-08-18 | Discharge: 2020-08-18 | Disposition: A | Discharge status: Home / Self Care | Payer: No Typology Code available for payment source | Attending: Family Medicine | Admitting: Family Medicine

## 2020-08-18 ENCOUNTER — Encounter (HOSPITAL_COMMUNITY): Payer: Self-pay

## 2020-08-18 DIAGNOSIS — S76319A Strain of muscle, fascia and tendon of the posterior muscle group at thigh level, unspecified thigh, initial encounter: Secondary | ICD-10-CM | POA: Diagnosis not present
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
## 2020-08-18 NOTE — Discharge Instructions (Signed)
Wear the compression Rest, ice Follow up with sports medicine

## 2020-08-23 ENCOUNTER — Ambulatory Visit (INDEPENDENT_AMBULATORY_CARE_PROVIDER_SITE_OTHER): Payer: Non-veteran care | Admitting: Sports Medicine

## 2020-08-23 ENCOUNTER — Other Ambulatory Visit: Payer: Self-pay

## 2020-08-23 VITALS — BP 152/90 | Ht 72.0 in | Wt 250.0 lb

## 2020-08-23 DIAGNOSIS — S76311A Strain of muscle, fascia and tendon of the posterior muscle group at thigh level, right thigh, initial encounter: Secondary | ICD-10-CM | POA: Insufficient documentation

## 2020-08-23 DIAGNOSIS — S76311D Strain of muscle, fascia and tendon of the posterior muscle group at thigh level, right thigh, subsequent encounter: Secondary | ICD-10-CM | POA: Diagnosis not present

## 2020-08-23 NOTE — Patient Instructions (Signed)
Thank you for coming to see me today. It was a pleasure. Today we talked about:   Do the hamstring exercises that we have showed you.  Use ice when you have pain.  Do NOT use heat.  You can continue to work, but do not do any heavy lifting.  Continue to use compression with a sleeve or ACE bandage, but only when you are awake.  Please follow-up with Dr. Margaretha Sheffield in 2 weeks for another ultrasound of your leg.  Best,   Luis Abed, DO
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
## 2020-08-23 NOTE — Assessment & Plan Note (Signed)
Improving since initial injury.  No Proximal avulsion noted on exam and supported by [place].  Patient was fitted with compression sleeve and advised to use this during the daytime, do not sleep with this.  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  Advised avoiding heavy lifting.  Overall can rest and ice.  [place] protocol demonstrated for patient and given information on exercises.  Return in 2 weeks for repeat ultrasound to ensure that hematoma and pain is improving.

## 2020-08-24 NOTE — Addendum Note (Signed)
Addended by: Reino Bellis R on: 08/24/2020 08:46 AM   Modules accepted: Level of Service

## 2020-09-06 ENCOUNTER — Ambulatory Visit (INDEPENDENT_AMBULATORY_CARE_PROVIDER_SITE_OTHER): Payer: Non-veteran care | Admitting: Sports Medicine

## 2020-09-06 ENCOUNTER — Other Ambulatory Visit: Payer: Self-pay

## 2020-09-06 VITALS — BP 131/90 | Ht 72.0 in | Wt 253.0 lb

## 2020-09-06 DIAGNOSIS — S76311D Strain of muscle, fascia and tendon of the posterior muscle group at thigh level, right thigh, subsequent encounter: Secondary | ICD-10-CM | POA: Diagnosis not present

## 2020-09-06 NOTE — Progress Notes (Signed)
   Subjective:    Patient ID: Derrick Foster, male    DOB: 1961-03-18, 59 y.o.   MRN: [id number]  HPI   Patient comes in today for follow-up on a right hamstring tear.  He is improving although not back to 100%.  He has purchased a thigh brace which is not a true compression sleeve.  It tends to slide on him during the day.  He was initially diligent with icing and his home exercises but has been doing them less as his pain has improved.   Review of Systems    As above Objective:   Physical Exam  Well-developed, well-nourished.  No acute distress.  Right hamstring: Previous area of soft tissue swelling has resolved.  Minimal tenderness to palpation over the medial hamstring.  Excellent strength with resisted hamstring at both 30 degrees and 9 degrees of flexion.  Neurovascularly intact distally.  Walking without a noticeable limp.  Limited MSK ultrasound of the right hamstring shows near resolution of the previously seen hematoma.  There is a large area of injury seen in the medial hamstring muscle belly consistent with a tear here.      Assessment & Plan:   Two and a half weeks status post right hamstring tear  Patient is improving both clinically as well as on ultrasound.  I did recommend that he purchase a true compression sleeve and he will need to wear that with activity for the next 4 weeks.  Continue with his home exercises.  He may start to increase activity as tolerated but he understands that it will be several weeks before complete symptom resolution.  As long as he continues to improve, even if progress is slow, he may follow-up with me as needed.

## 2020-10-25 ENCOUNTER — Other Ambulatory Visit: Payer: Self-pay

## 2020-10-25 DIAGNOSIS — Z20822 Contact with and (suspected) exposure to covid-19: Secondary | ICD-10-CM

## 2020-10-27 LAB — NOVEL CORONAVIRUS, NAA: SARS-CoV-2, NAA: NOT DETECTED

## 2020-10-27 LAB — SARS-COV-2, NAA 2 DAY TAT

## 2021-05-09 ENCOUNTER — Encounter (HOSPITAL_COMMUNITY): Payer: Self-pay

## 2021-07-29 ENCOUNTER — Encounter (HOSPITAL_COMMUNITY): Payer: Self-pay | Admitting: Emergency Medicine

## 2021-07-29 ENCOUNTER — Emergency Department (HOSPITAL_COMMUNITY)
Admission: EM | Admit: 2021-07-29 | Discharge: 2021-07-29 | Disposition: A | Discharge status: Home / Self Care | Payer: No Typology Code available for payment source | Attending: Emergency Medicine | Admitting: Emergency Medicine

## 2021-07-29 ENCOUNTER — Emergency Department (HOSPITAL_COMMUNITY): Payer: No Typology Code available for payment source

## 2021-07-29 DIAGNOSIS — Z79899 Other long term (current) drug therapy: Secondary | ICD-10-CM | POA: Diagnosis not present

## 2021-07-29 DIAGNOSIS — I10 Essential (primary) hypertension: Secondary | ICD-10-CM | POA: Insufficient documentation

## 2021-07-29 DIAGNOSIS — R109 Unspecified abdominal pain: Secondary | ICD-10-CM | POA: Insufficient documentation

## 2021-07-29 DIAGNOSIS — F17210 Nicotine dependence, cigarettes, uncomplicated: Secondary | ICD-10-CM | POA: Insufficient documentation

## 2021-07-29 DIAGNOSIS — Z7982 Long term (current) use of aspirin: Secondary | ICD-10-CM | POA: Insufficient documentation

## 2021-07-29 DIAGNOSIS — M25511 Pain in right shoulder: Secondary | ICD-10-CM | POA: Diagnosis not present

## 2021-07-29 LAB — COMPREHENSIVE METABOLIC PANEL
ALT: 23 U/L (ref 0–44)
AST: 15 U/L (ref 15–41)
Albumin: 3.5 g/dL (ref 3.5–5.0)
Alkaline Phosphatase: 96 U/L (ref 38–126)
Anion gap: 5 (ref 5–15)
BUN: 10 mg/dL (ref 8–23)
CO2: 29 mmol/L (ref 22–32)
Calcium: 8.7 mg/dL — ABNORMAL LOW (ref 8.9–10.3)
Chloride: 103 mmol/L (ref 98–111)
Creatinine, Ser: 1 mg/dL (ref 0.61–1.24)
GFR, Estimated: >60 mL/min (ref >60)
Glucose, Bld: 112 mg/dL — ABNORMAL HIGH (ref 70–99)
Potassium: 4.1 mmol/L (ref 3.5–5.1)
Sodium: 137 mmol/L (ref 135–145)
Total Bilirubin: 1 mg/dL (ref 0.3–1.2)
Total Protein: 6.9 g/dL (ref 6.5–8.1)

## 2021-07-29 LAB — URINALYSIS, ROUTINE W REFLEX MICROSCOPIC
Bilirubin Urine: NEGATIVE
Glucose, UA: NEGATIVE mg/dL
Hgb urine dipstick: NEGATIVE
Ketones, ur: NEGATIVE mg/dL
Leukocytes,Ua: NEGATIVE
Nitrite: NEGATIVE
Protein, ur: NEGATIVE mg/dL
Specific Gravity, Urine: >1.03 — ABNORMAL HIGH (ref 1.005–1.030)
pH: 6 (ref 5.0–8.0)

## 2021-07-29 LAB — CBC
HCT: 44.7 % (ref 39.0–52.0)
Hemoglobin: 14.9 g/dL (ref 13.0–17.0)
MCH: 29.4 pg (ref 26.0–34.0)
MCHC: 33.3 g/dL (ref 30.0–36.0)
MCV: 88.3 fL (ref 80.0–100.0)
Platelets: 255 10*3/uL (ref 150–400)
RBC: 5.06 MIL/uL (ref 4.22–5.81)
RDW: 12 % (ref 11.5–15.5)
WBC: 9.2 10*3/uL (ref 4.0–10.5)
nRBC: 0 % (ref 0.0–0.2)

## 2021-07-29 LAB — TROPONIN I (HIGH SENSITIVITY)
Troponin I (High Sensitivity): 7 ng/L (ref <18)
Troponin I (High Sensitivity): 8 ng/L (ref <18)

## 2021-07-29 LAB — LIPASE, BLOOD: Lipase: 24 U/L (ref 11–51)

## 2021-07-29 MED ORDER — HYDROCODONE-ACETAMINOPHEN 5-325 MG PO TABS
1.0000 | ORAL_TABLET | Freq: Once | ORAL | Status: AC
  Administered 2021-07-29: 1 via ORAL
  Filled 2021-07-29: qty 1

## 2021-07-29 NOTE — ED Notes (Signed)
Pt updated on plan of care and delay. Pt receptive to information provided. A&O 4 and ambulatory.

## 2021-07-29 NOTE — ED Provider Notes (Signed)
[facility] Provider Note   CSN: [id number] Arrival date & time: 07/29/21  1216     History Chief Complaint  Patient presents with   Abdominal Pain    Derrick Foster is a 60 y.o. male.  HPI Patient is a 60 year old male who presents to the emergency department due to abdominal pain.  Patient states his symptoms started about 1 week ago.  States they were intermittent and began spreading to his low back.  States his symptoms resolved and then began experiencing right shoulder pain.  States his right shoulder pain worsens with movement as well as exertion.  Denies any chest pain, shortness of breath, nausea, vomiting, urinary complaints.  States he was evaluated [facility] and started on cyclobenzaprine which she has been taking 3 times per day with mild short-term relief.    Past Medical History:  Diagnosis Date   Hypertension     Patient Active Problem List   Diagnosis Date Noted   Strain of right hamstring 08/23/2020    History reviewed. No pertinent surgical history.     Family History  Problem Relation Age of Onset   Cancer Mother    Heart failure Father     Social History   Tobacco Use   Smoking status: Every Day    Packs/day: 0.50    Types: Cigarettes   Smokeless tobacco: Current  Vaping Use   Vaping Use: Never used  Substance Use Topics   Alcohol use: Yes    Alcohol/week: 1.0 standard drink    Types: 1 Shots of liquor per week    Comment: every day   Drug use: No    Home Medications Prior to Admission medications   Medication Sig Start Date End Date Taking? Authorizing Provider  amLODipine (NORVASC) 10 MG tablet Take 10 mg by mouth daily.   Yes [provider]  aspirin EC 81 MG tablet Take 81 mg by mouth daily.   Yes [provider]  cholecalciferol (VITAMIN D3) 25 MCG (1000 UNIT) tablet Take 1,000 Units by mouth daily.   Yes [provider]  cyclobenzaprine (FLEXERIL) 10 MG tablet  Take 5 mg by mouth 3 (three) times daily as needed for muscle spasms.   Yes [provider]  hydrochlorothiazide (HYDRODIURIL) 25 MG tablet Take 25 mg by mouth daily.   Yes [provider]  ibuprofen (ADVIL) 200 MG tablet Take 400 mg by mouth daily.   Yes [provider]  Multiple Vitamin (MULTIVITAMIN WITH MINERALS) TABS Take 1 tablet by mouth daily.   Yes [provider]  Probiotic Product (PROBIOTIC DAILY PO) Take 1 capsule by mouth daily.    Yes [provider]  vitamin B-12 (CYANOCOBALAMIN) 100 MCG tablet Take 100 mcg by mouth daily.   Yes [provider]  ibuprofen (ADVIL,MOTRIN) 400 MG tablet Take 1 tablet (400 mg total) by mouth every 6 (six) hours as needed. Patient not taking: Reported on 07/29/2021 10/15/15   Joycie Peekartner, Benjamin, PA-C    Allergies    Patient has no known allergies.  Review of Systems   Review of Systems  All other systems reviewed and are negative. Ten systems reviewed and are negative for acute change, except as noted in the HPI.   Physical Exam Updated Vital Signs BP (!) 156/106   Pulse 85   Temp 99.2 F (37.3 C) (Oral)   Resp (!) 26   SpO2 94%   Physical Exam Vitals and nursing note reviewed.  Constitutional:      General: He is not in acute distress.    Appearance: Normal appearance. He is not ill-appearing, toxic-appearing or diaphoretic.  HENT:     Head: Normocephalic and atraumatic.     Right Ear: External ear normal.     Left Ear: External ear normal.     Nose: Nose normal.     Mouth/Throat:     Mouth: Mucous membranes are moist.     Pharynx: Oropharynx is clear. No oropharyngeal exudate or posterior oropharyngeal erythema.  Eyes:     Extraocular Movements: Extraocular movements intact.  Cardiovascular:     Rate and Rhythm: Normal rate and regular rhythm.     Pulses: Normal pulses.     Heart sounds: Normal heart sounds. No murmur heard.   No friction rub. No gallop.  Pulmonary:      Effort: Pulmonary effort is normal. No respiratory distress.     Breath sounds: Normal breath sounds. No stridor. No wheezing, rhonchi or rales.  Abdominal:     General: Abdomen is flat and protuberant.     Palpations: Abdomen is soft.     Tenderness: There is no abdominal tenderness.     Comments: Protuberant abdomen that is soft and nontender in all 4 quadrants.  No CVA tenderness noted.  Musculoskeletal:        General: Normal range of motion.     Cervical back: Normal range of motion and neck supple. No tenderness.     Comments: No midline C, T, or L-spine tenderness.  No tenderness appreciated along the cervical, thoracic, or lumbar regions.  No tenderness appreciated along the right shoulder.  Full active and passive range of motion of the right shoulder.  Skin:    General: Skin is warm and dry.  Neurological:     General: No focal deficit present.     Mental Status: He is alert and oriented to person, place, and time.  Psychiatric:        Mood and Affect: Mood normal.        Behavior: Behavior normal.   ED Results / Procedures / Treatments   Labs (all labs ordered are listed, but only abnormal results are displayed) Labs Reviewed  COMPREHENSIVE METABOLIC PANEL - Abnormal; Notable for the following components:      Result Value   Glucose, Bld 112 (*)    Calcium 8.7 (*)    All other components within normal limits  URINALYSIS, ROUTINE W REFLEX MICROSCOPIC - Abnormal; Notable for the following components:   Specific Gravity, Urine >1.030 (*)    All other components within normal limits  LIPASE, BLOOD  CBC  TROPONIN I (HIGH SENSITIVITY)  TROPONIN I (HIGH SENSITIVITY)    EKG EKG Interpretation  Date/Time:  Saturday July 29 2021 14:12:27 EDT Ventricular Rate:  88 PR Interval:  144 QRS Duration: 82 QT Interval:  362 QTC Calculation: 438 R Axis:   62 Text Interpretation: Normal sinus rhythm Normal ECG Confirmed by Alvester Chou [phone]) on 07/29/2021 6:43:49  PM  Radiology DG Chest Portable 1 View  Result Date: 07/29/2021 CLINICAL DATA:  Chest and upper back pain EXAM: PORTABLE CHEST 1 VIEW COMPARISON:  12/09/2012 FINDINGS: Single frontal view of the chest demonstrates an unremarkable cardiac silhouette. No airspace disease, effusion, or pneumothorax. No acute bony abnormality. IMPRESSION: 1. No acute intrathoracic process. Electronically Signed   By: Sharlet Salina M.D.   On: 07/29/2021 16:50    Procedures Procedures   Medications Ordered in ED Medications  HYDROcodone-acetaminophen (NORCO/VICODIN) 5-325 MG per tablet 1 tablet (1 tablet Oral Given 07/29/21 1732)    ED Course  I have reviewed the triage vital signs and the nursing notes.  Pertinent labs & imaging results that were available during my care of the patient were reviewed by me and considered in my medical decision making (see chart for details).  Clinical Course as of 07/29/21 9767  Sat Jul 29, 2021  3419 This is a 60 year old male with history of smoking presenting to emergency department with back pain.  Patient reports onset of symptoms about a week ago when he woke up.  His pain is extremely positional, worse with sitting up.  He has been taking Flexeril at home which does ease up his pain, as well as applying icy hot to his back.  He describes a throbbing pain between his shoulder blades and also in his lower back.  He denies any known injury, but does work as a Museum/gallery exhibitions officer.  He denies any chest pain, nausea, vomiting, diarrhea.  He denies abdominal pain.  Vital signs are unremarkable and his labs are all within normal limits.  He is pending a troponin level.  Chest x-ray is also unremarkable.  Overall have a fairly low clinical suspicion for ACS, aortic dissection, PE, or other immediate emergency.  His exam is quite consistent with musculoskeletal strain, with paraspinal muscle tenderness bilaterally of the lumbar spine, as well as some mid thoracic tenderness.  His pain is  also worse with sitting upright and is extremely positional.  I advised that he continue his muscle relaxer, continue ibuprofen and Tylenol at home as he is not taking regular doses.  We also discussed return precautions [MT]  1944 Troponin I (High Sensitivity): 7 [MT]    Clinical Course User Index [MT] Trifan, Kermit Balo, MD   MDM Rules/Calculators/A&P                          Pt is a 60 y.o. male who presents to the emergency department due to pain to his abdomen, back, as well as right shoulder.  Labs: CBC without abnormalities. CMP with a glucose of 112 and a calcium of 8.7. UA with specific gravity elevated. Troponin of 7 with a repeat of 8.  Imaging: Chest x-ray shows no acute intrathoracic process.  ECG: Normal sinus rhythm.  I, Placido Sou, PA-C, personally reviewed and evaluated these images and lab results as part of my medical decision-making.  Unsure the source of the patient's symptoms.  Reassuring lab work without leukocytosis.  Patient afebrile and not tachycardic.  Doubt infectious process.  Abdomen was soft and nontender in all 4 quadrants.  No palpable pain appreciated along the back or right shoulder.  [facility] for his symptoms earlier this week and notes relief short-term when taking this.  His symptoms today are likely musculoskeletal in nature.  Given his vague right shoulder pain and no history of trauma I obtained a chest x-ray, ECG, as well as troponins which were all reassuring.  Doubt ACS at this time.  Feel that the patient is stable for discharge at this time and he is agreeable.  We discussed return precautions in length and he knows to return to the emergency department with any new or worsening symptoms.  His questions were answered and he was amicable at the time of discharge.  Note: Portions of this  report may have been transcribed using voice recognition software. Every effort was  made to ensure accuracy; however, inadvertent computerized transcription errors may be present.   Final Clinical Impression(s) / ED Diagnoses Final diagnoses:  Acute pain of right shoulder   Rx / DC Orders ED Discharge Orders     None        Placido Sou, PA-C 07/29/21 2143    Terald Sleeper, MD 07/30/21 [phone]

## 2021-07-29 NOTE — ED Triage Notes (Signed)
Pt reports lower abd pain that started 1 week ago.  Pain moved to R flank and now R shoulder blade.  Denies sob, nausea, vomiting.  Denies urinary complaints.

## 2021-07-29 NOTE — Discharge Instructions (Signed)
I would recommend you continue taking your cyclobenzaprine as prescribed.  Please continue to monitor your symptoms closely and if they worsen over the weekend please come back to the emergency department.  [facility]  It was a pleasure to meet you.

## 2022-09-06 IMAGING — DX DG CHEST 1V PORT
1 series · 1 of 1 positions shown · non-contrast
Comparison: 12/09/2012

CLINICAL DATA: Chest and upper back pain

EXAM:
PORTABLE CHEST 1 VIEW

[chest ap]
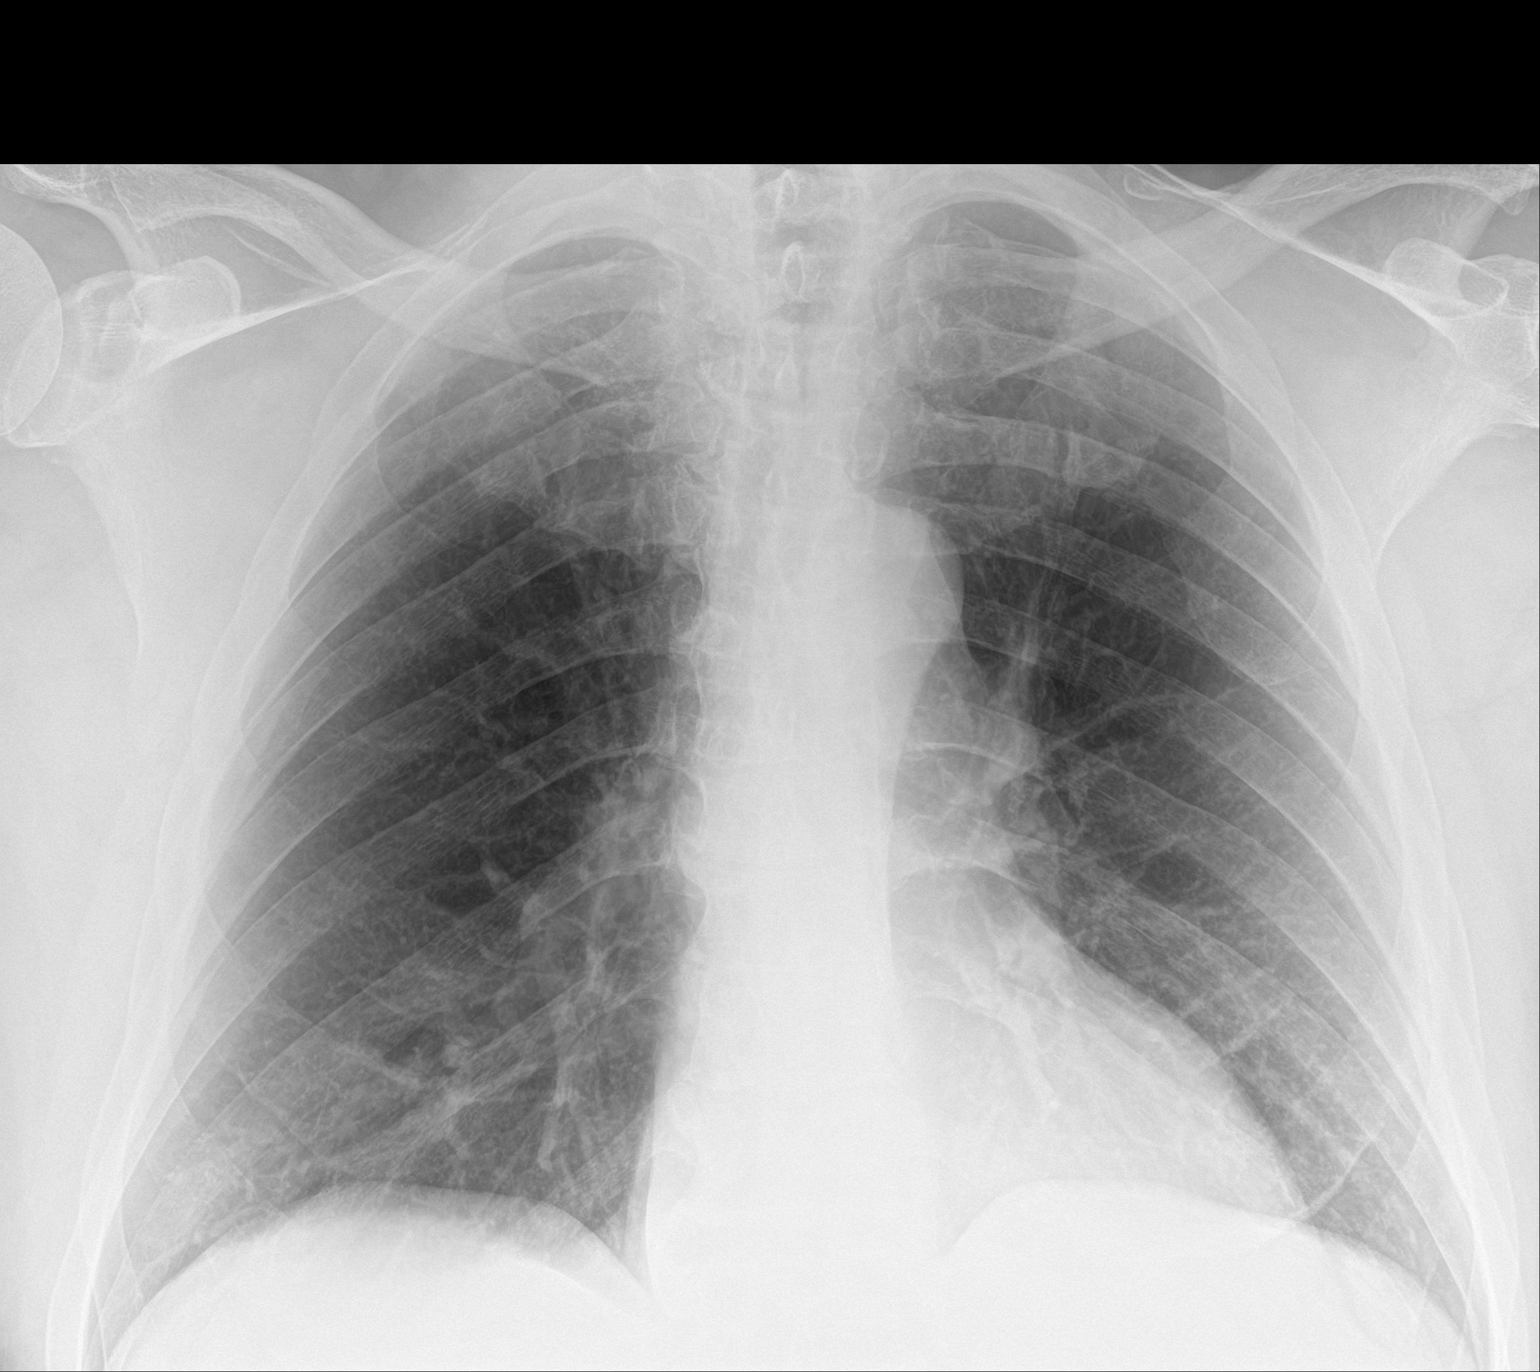

[1 of 1 positions shown; findings below may reference images not displayed]

FINDINGS: Single frontal view of the chest demonstrates an unremarkable
cardiac silhouette. No airspace disease, effusion, or pneumothorax.
No acute bony abnormality.
IMPRESSION: 1. No acute intrathoracic process.

## 2023-10-02 ENCOUNTER — Encounter: Payer: Self-pay | Admitting: Pulmonary Disease

## 2023-10-02 ENCOUNTER — Ambulatory Visit (INDEPENDENT_AMBULATORY_CARE_PROVIDER_SITE_OTHER): Payer: Managed Care, Other (non HMO) | Admitting: Pulmonary Disease

## 2023-10-02 VITALS — BP 120/88 | HR 78 | Temp 98.8°F | Ht 72.0 in | Wt 251.4 lb

## 2023-10-02 DIAGNOSIS — R053 Chronic cough: Secondary | ICD-10-CM | POA: Diagnosis not present

## 2023-10-02 DIAGNOSIS — J432 Centrilobular emphysema: Secondary | ICD-10-CM | POA: Diagnosis not present

## 2023-10-02 MED ORDER — CETIRIZINE HCL 10 MG PO TABS: 10.0000 mg | ORAL_TABLET | Freq: Every day | ORAL | 2 refills | Status: DC

## 2023-10-02 MED ORDER — FLUTICASONE PROPIONATE 50 MCG/ACT NA SUSP: NASAL | 2 refills | Status: DC

## 2023-10-02 NOTE — Patient Instructions (Signed)
It is nice to meet you  The cough with mucus production is either coming from the nose or sinuses or the lungs.  Given you are having a lot of runny nose I think we should start targeting that first.  Use Flonase 1 spray each nostril twice a day for 2 weeks and if improving you can decrease to once a day  Take an antihistamine at night, I recommend Zyrtec or the generic name is cetirizine.  Other brand names include Claritin or Allegra if you prefer.  Take 1 tablet at night.  Every day.  If cough is not improving in about 1 month please call the office and let me know and we can consider trying an inhaler.  Inhalers can be hard to use, I recommend if we need an inhaler going to YouTube and search " [employer] how to use a metered-dose inhaler".  There are very helpful and informative video showing proper technique of using inhalers.  Return to clinic in 2 months or sooner as needed with Dr. Judeth Horn

## 2023-10-02 NOTE — Progress Notes (Signed)
@Patient  ID: Derrick Foster, male    DOB: [birth year], 62 y.o.   MRN: [id number]  Chief Complaint  Patient presents with  . Consult    SOB with exertion.  Chest congestion, cough and wheezing x 2 months.    Referring provider: Barry Dienes, MD  HPI:   62 y.o. man whom we are seeing for evaluation of chronic cough.  Multiple notes from the [place] reviewed.  Patient notes onset of cough for the last many weeks.  2 months or more.  Really started with decrease in temperature.  Change of season.  A lot of rhinorrhea, nasal congestion.  Blowing nose.  Particular at work.  He works in a Engineer, materials.  He notes every time this year he gets more nasal congestion.  Cough a bit worse this time.  Produces mucus most times.  Feels like there is mucus in the back of his throat.  Endorses postnasal drip symptoms.  No history of asthma.  No shortness of breath.  No chest tightness.  Reported some wheeze but none on exam.  He was seen by the [place].  He was prescribed inhalers.  He does not know the name.  No one told him how to use them.  He picked him up from the pharmacy but never used them because he did not know how.  He is not sure where they are.  Cannot locate them.  He had a CT scan low-dose lung cancer screening chest 09/08/2023 in the midst of the symptoms that on my review revealed emphysematous changes, scattered nodules throughout the lungs largest 4 mm.  Read as lung RADS two 1 year follow-up low-dose CT scan was recommended.  Questionaires / Pulmonary Flowsheets:   ACT:      No data to display          MMRC:     No data to display          Epworth:      No data to display          Tests:   FENO:  No results found for: "NITRICOXIDE"  PFT:     No data to display          WALK:      No data to display          Imaging: Personally reviewed and as per EMR and discussion in this note No results found.  Lab Results: Personally reviewed CBC     Component Value Date/Time   WBC 9.2 07/29/2021 1413   RBC 5.06 07/29/2021 1413   HGB 14.9 07/29/2021 1413   HCT 44.7 07/29/2021 1413   PLT 255 07/29/2021 1413   MCV 88.3 07/29/2021 1413   MCH 29.4 07/29/2021 1413   MCHC 33.3 07/29/2021 1413   RDW 12.0 07/29/2021 1413   LYMPHSABS 0.5 (L) 12/09/2012 0200   MONOABS 0.4 12/09/2012 0200   EOSABS 0.1 12/09/2012 0200   BASOSABS 0.0 12/09/2012 0200    BMET    Component Value Date/Time   NA 137 07/29/2021 1413   K 4.1 07/29/2021 1413   CL 103 07/29/2021 1413   CO2 29 07/29/2021 1413   GLUCOSE 112 (H) 07/29/2021 1413   BUN 10 07/29/2021 1413   CREATININE 1.00 07/29/2021 1413   CALCIUM 8.7 (L) 07/29/2021 1413   GFRNONAA >60 07/29/2021 1413   GFRAA 81 (L) 12/09/2012 0200    BNP No results found for: "BNP"  ProBNP No results found for: "PROBNP"  Specialty Problems   None   No Known Allergies   There is no immunization history on file for this patient.  Past Medical History:  Diagnosis Date  . Hypertension     Tobacco History: Social History   Tobacco Use  Smoking Status Every Day  . Current packs/day: 0.50  . Types: Cigarettes  Smokeless Tobacco Current   Ready to quit: Not Answered Counseling given: Not Answered   Continue to not smoke  Outpatient Encounter Medications as of 10/02/2023  Medication Sig  . amLODipine (NORVASC) 10 MG tablet Take 10 mg by mouth daily.  Marland Kitchen aspirin EC 81 MG tablet Take 81 mg by mouth daily.  . cholecalciferol (VITAMIN D3) 25 MCG (1000 UNIT) tablet Take 1,000 Units by mouth daily.  . clotrimazole (LOTRIMIN) 1 % external solution Apply 1 Application topically 2 (two) times daily.  . hydrochlorothiazide (HYDRODIURIL) 25 MG tablet Take 25 mg by mouth daily.  Marland Kitchen ibuprofen (ADVIL) 200 MG tablet Take 400 mg by mouth daily.  . Multiple Vitamin (MULTIVITAMIN WITH MINERALS) TABS Take 1 tablet by mouth daily.  . vitamin B-12 (CYANOCOBALAMIN) 100 MCG tablet Take 100 mcg by mouth daily.   Marland Kitchen albuterol (VENTOLIN HFA) 108 (90 Base) MCG/ACT inhaler Inhale 2 puffs into the lungs every 4 (four) hours as needed for wheezing or shortness of breath. (Patient not taking: Reported on 10/02/2023)  . [DISCONTINUED] cyclobenzaprine (FLEXERIL) 10 MG tablet Take 5 mg by mouth 3 (three) times daily as needed for muscle spasms. (Patient not taking: Reported on 10/02/2023)  . [DISCONTINUED] ibuprofen (ADVIL,MOTRIN) 400 MG tablet Take 1 tablet (400 mg total) by mouth every 6 (six) hours as needed. (Patient not taking: Reported on 10/02/2023)  . [DISCONTINUED] Probiotic Product (PROBIOTIC DAILY PO) Take 1 capsule by mouth daily.  (Patient not taking: Reported on 10/02/2023)   No facility-administered encounter medications on file as of 10/02/2023.     Review of Systems  Review of Systems  No chest pain with exertion.  No orthopnea or PND.  Comprehensive review of systems otherwise negative. Physical Exam  BP 120/88 (BP Location: Left Arm, Patient Position: Sitting, Cuff Size: Large)   Pulse 78   Temp 98.8 F (37.1 C) (Oral)   Ht 6' (1.829 m)   Wt 251 lb 6.4 oz (114 kg)   SpO2 100%   BMI 34.10 kg/m   Wt Readings from Last 5 Encounters:  10/02/23 251 lb 6.4 oz (114 kg)  09/06/20 253 lb (114.8 kg)  08/23/20 250 lb (113.4 kg)  08/13/19 260 lb (117.9 kg)  05/05/19 250 lb (113.4 kg)    BMI Readings from Last 5 Encounters:  10/02/23 34.10 kg/m  09/06/20 34.31 kg/m  08/23/20 33.91 kg/m  08/13/19 35.26 kg/m  05/05/19 33.91 kg/m     Physical Exam General: Sitting in chair, no acute distress Eyes: EOMI, icterus Neck: Supple, no JVP Pulmonary: Clear, normal work of breathing Cardiovascular: Warm, no edema Abdomen: Nondistended, bowel sounds present MSK: No synovitis, no joint effusion Neuro: Normal gait, no weakness Psych: Normal mood, full affect   Assessment & Plan:   Chronic cough: Present for weeks.  With change of seasons.  High suspicion for allergic trigger.   Seems he has a lot of rhinorrhea and nasal congestion.  Worse when at work.  Will start initiatives at controlling rhinorrhea/nasal allergy symptoms.  Daily antihistamine.  Flonase 1 spray each nostril twice a day until symptoms improve then can decrease to once daily.  If not improving can offer  ICS/LABA inhaler in case mucus production is coming from lungs, more asthmatic phenotype.  Emphysema: Noted on CT scan report 09/08/2023.  Related cigarette smoking.  Encouraged him to decrease cigarette smoking.  Lung nodules: Scattered multiple largest 4 mm.  Lung RADS 2.  Recommend yearly CT scan for lung cancer screening, next 08/2024.   Return in about 2 months (around 12/02/2023) for f/u Dr. Judeth Horn.   Karren Burly, MD 10/02/2023   This appointment required 62 minutes of patient care (this includes precharting, chart review, review of results, face-to-face care, etc.).

## 2023-12-23 ENCOUNTER — Telehealth: Payer: Self-pay | Admitting: Pulmonary Disease

## 2023-12-23 ENCOUNTER — Ambulatory Visit: Payer: Managed Care, Other (non HMO) | Admitting: Pulmonary Disease

## 2023-12-23 MED ORDER — FLUTICASONE PROPIONATE 50 MCG/ACT NA SUSP: NASAL | 2 refills | Status: AC

## 2023-12-23 MED ORDER — CETIRIZINE HCL 10 MG PO TABS: 10.0000 mg | ORAL_TABLET | Freq: Every day | ORAL | 2 refills | Status: AC

## 2023-12-23 NOTE — Telephone Encounter (Signed)
Flonase and Zytrec has been sent to preferred pharmacy,  Pt is aware and voiced his understanding.  He stated that he does not need a refill on Ventolin, as he does not use it. Nothing further needed.

## 2023-12-23 NOTE — Telephone Encounter (Signed)
Patient states needs refill for Flonase, inhaler, and Teirizine hcl 10. Pharmacy is [facility] [address] Patient phone number is [phone].

## 2024-02-17 ENCOUNTER — Ambulatory Visit: Payer: Managed Care, Other (non HMO) | Admitting: Pulmonary Disease

## 2024-02-17 ENCOUNTER — Encounter: Payer: Self-pay | Admitting: Pulmonary Disease

## 2024-02-17 VITALS — BP 128/82 | HR 91 | Ht 72.0 in | Wt 245.0 lb

## 2024-02-17 DIAGNOSIS — J439 Emphysema, unspecified: Secondary | ICD-10-CM

## 2024-02-17 DIAGNOSIS — R053 Chronic cough: Secondary | ICD-10-CM

## 2024-02-17 DIAGNOSIS — R918 Other nonspecific abnormal finding of lung field: Secondary | ICD-10-CM | POA: Diagnosis not present

## 2024-02-17 DIAGNOSIS — R0982 Postnasal drip: Secondary | ICD-10-CM | POA: Diagnosis not present

## 2024-02-17 NOTE — Progress Notes (Signed)
 @Patient  ID: Derrick Foster, male    DOB: 12/22/1960, 63 y.o.   MRN: [id number]  Chief Complaint  Patient presents with   Follow-up    Referring provider: No ref. provider found  HPI:   63 y.o. man whom we are seeing for evaluation of chronic cough.  Multiple telephone encounter notes from our office in interim reviewed.  At last visit we targeted postnasal drip with Flonase nasal spray and nightly antihistamine.  Cough is much improved.  Even with increased pollens.  Elevated tickle but overall much better.  Just using Flonase currently.  He does not think he is taking the antihistamine any longer.  HPI at initial visit: Patient notes onset of cough for the last many weeks.  2 months or more.  Really started with decrease in temperature.  Change of season.  A lot of rhinorrhea, nasal congestion.  Blowing nose.  Particular at work.  He works in a Engineer, materials.  He notes every time this year he gets more nasal congestion.  Cough a bit worse this time.  Produces mucus most times.  Feels like there is mucus in the back of his throat.  Endorses postnasal drip symptoms.  No history of asthma.  No shortness of breath.  No chest tightness.  Reported some wheeze but none on exam.  He was seen by the [place].  He was prescribed inhalers.  He does not know the name.  No one told him how to use them.  He picked him up from the pharmacy but never used them because he did not know how.  He is not sure where they are.  Cannot locate them.  He had a CT scan low-dose lung cancer screening chest 09/08/2023 in the midst of the symptoms that on my review revealed emphysematous changes, scattered nodules throughout the lungs largest 4 mm.  Read as lung RADS two 1 year follow-up low-dose CT scan was recommended.  Questionaires / Pulmonary Flowsheets:   ACT:      No data to display          MMRC:     No data to display          Epworth:      No data to display          Tests:    FENO:  No results found for: "NITRICOXIDE"  PFT:     No data to display          WALK:      No data to display          Imaging: Personally reviewed and as per EMR and discussion in this note No results found.  Lab Results: Personally reviewed CBC    Component Value Date/Time   WBC 9.2 07/29/2021 1413   RBC 5.06 07/29/2021 1413   HGB 14.9 07/29/2021 1413   HCT 44.7 07/29/2021 1413   PLT 255 07/29/2021 1413   MCV 88.3 07/29/2021 1413   MCH 29.4 07/29/2021 1413   MCHC 33.3 07/29/2021 1413   RDW 12.0 07/29/2021 1413   LYMPHSABS 0.5 (L) 12/09/2012 0200   MONOABS 0.4 12/09/2012 0200   EOSABS 0.1 12/09/2012 0200   BASOSABS 0.0 12/09/2012 0200    BMET    Component Value Date/Time   NA 137 07/29/2021 1413   K 4.1 07/29/2021 1413   CL 103 07/29/2021 1413   CO2 29 07/29/2021 1413   GLUCOSE 112 (H) 07/29/2021 1413   BUN 10 07/29/2021 1413  CREATININE 1.00 07/29/2021 1413   CALCIUM 8.7 (L) 07/29/2021 1413   GFRNONAA >60 07/29/2021 1413   GFRAA 81 (L) 12/09/2012 0200    BNP No results found for: "BNP"  ProBNP No results found for: "PROBNP"  Specialty Problems   None   No Known Allergies   There is no immunization history on file for this patient.  Past Medical History:  Diagnosis Date   Hypertension     Tobacco History: Social History   Tobacco Use  Smoking Status Every Day   Current packs/day: 0.50   Types: Cigarettes  Smokeless Tobacco Current   Ready to quit: Not Answered Counseling given: Not Answered   Continue to not smoke  Outpatient Encounter Medications as of 02/17/2024  Medication Sig   albuterol (VENTOLIN HFA) 108 (90 Base) MCG/ACT inhaler Inhale 2 puffs into the lungs every 4 (four) hours as needed for wheezing or shortness of breath.   amLODipine (NORVASC) 10 MG tablet Take 10 mg by mouth daily.   aspirin EC 81 MG tablet Take 81 mg by mouth daily.   cholecalciferol (VITAMIN D3) 25 MCG (1000 UNIT) tablet Take 1,000  Units by mouth daily.   clotrimazole (LOTRIMIN) 1 % external solution Apply 1 Application topically 2 (two) times daily.   hydrochlorothiazide (HYDRODIURIL) 25 MG tablet Take 25 mg by mouth daily.   ibuprofen (ADVIL) 200 MG tablet Take 400 mg by mouth daily.   Multiple Vitamin (MULTIVITAMIN WITH MINERALS) TABS Take 1 tablet by mouth daily.   vitamin B-12 (CYANOCOBALAMIN) 100 MCG tablet Take 100 mcg by mouth daily.   cetirizine (ZYRTEC) 10 MG tablet Take 1 tablet (10 mg total) by mouth daily. (Patient not taking: Reported on 02/17/2024)   fluticasone (FLONASE) 50 MCG/ACT nasal spray 1 spray each nostril twice a day until symptoms improve, then can decrease to 1 spray each nostril once a day (Patient not taking: Reported on 02/17/2024)   No facility-administered encounter medications on file as of 02/17/2024.     Review of Systems  Review of Systems  N/a Physical Exam  BP 128/82 (BP Location: Left Arm, Cuff Size: Normal)   Pulse 91   Ht 6' (1.829 m)   Wt 245 lb (111.1 kg)   SpO2 95%   BMI 33.23 kg/m   Wt Readings from Last 5 Encounters:  02/17/24 245 lb (111.1 kg)  10/02/23 251 lb 6.4 oz (114 kg)  09/06/20 253 lb (114.8 kg)  08/23/20 250 lb (113.4 kg)  08/13/19 260 lb (117.9 kg)    BMI Readings from Last 5 Encounters:  02/17/24 33.23 kg/m  10/02/23 34.10 kg/m  09/06/20 34.31 kg/m  08/23/20 33.91 kg/m  08/13/19 35.26 kg/m     Physical Exam General: Sitting in chair, no acute distress Eyes: EOMI, icterus Neck: Supple, no JVP Pulmonary: Clear, normal work of breathing Cardiovascular: Warm, no edema Abdomen: Nondistended, bowel sounds present MSK: No synovitis, no joint effusion Neuro: Normal gait, no weakness Psych: Normal mood, full affect   Assessment & Plan:   Chronic cough: Present for weeks.  With change of seasons.  High suspicion for allergic trigger.  Seems he has a lot of rhinorrhea and nasal congestion.  Worse when at work.  Markedly improved with  intranasal steroid, Flonase, as well as antihistamine.  No longer using antihistamine.  Continue Flonase, and increase as needed.  Instructions for resuming and resuming antihistamine given today.  If needed based on sick symptoms.  Emphysema: Noted on CT scan report 09/08/2023.  Related cigarette  smoking.  Encouraged him to decrease cigarette smoking.  Lung nodules: Scattered multiple largest 4 mm.  Lung RADS 2.  Recommend yearly CT scan for lung cancer screening, next 08/2024.  [facility]   Return in about 1 year (around 02/16/2025) for f/u Dr. Judeth Horn.   Karren Burly, MD 02/17/2024

## 2024-02-17 NOTE — Patient Instructions (Signed)
 Nice to see you again  Continue the Flonase as you are, if things continue to do well and after pollen season you could cut back to stop using.  If the cough returns I would resume Flonase 1 spray twice a day for a week then decrease to daily.  In addition, I would resume an antihistamine.  Brand names include Zyrtec, Allegra, Claritin.  Return to clinic in 1 year or sooner as needed with Dr. Judeth Horn

## 2024-06-17 ENCOUNTER — Other Ambulatory Visit: Payer: Self-pay

## 2024-06-17 ENCOUNTER — Encounter (HOSPITAL_COMMUNITY): Payer: Self-pay | Admitting: Emergency Medicine

## 2024-06-17 ENCOUNTER — Emergency Department (HOSPITAL_COMMUNITY)
Admission: EM | Admit: 2024-06-17 | Discharge: 2024-06-17 | Disposition: A | Discharge status: Home / Self Care | Attending: Student | Admitting: Student

## 2024-06-17 DIAGNOSIS — X58XXXA Exposure to other specified factors, initial encounter: Secondary | ICD-10-CM | POA: Diagnosis not present

## 2024-06-17 DIAGNOSIS — F17200 Nicotine dependence, unspecified, uncomplicated: Secondary | ICD-10-CM | POA: Diagnosis not present

## 2024-06-17 DIAGNOSIS — T148XXA Other injury of unspecified body region, initial encounter: Secondary | ICD-10-CM

## 2024-06-17 DIAGNOSIS — S161XXA Strain of muscle, fascia and tendon at neck level, initial encounter: Secondary | ICD-10-CM | POA: Insufficient documentation

## 2024-06-17 NOTE — ED Provider Notes (Signed)
 [place] EMERGENCY DEPARTMENT AT [facility] Provider Note   CSN: [id number] Arrival date & time: 06/17/24  1407     Patient presents with: Neck Pain   Derrick Foster is a 63 y.o. male presents to emergency department for work note.  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    Reports that he woke up with difficulty moving his neck side-to-side on July 17.  He then sought urgent care evaluation who provided him with NSAIDs, Voltaren  gel, muscle relaxer with complete resolution of symptoms.  Currently, has no complaints of neck pain, stiffness, fever, paresthesia, numbness    Neck Pain      Prior to Admission medications   Medication Sig Start Date End Date Taking? Authorizing Provider  albuterol (VENTOLIN HFA) 108 (90 Base) MCG/ACT inhaler Inhale 2 puffs into the lungs every 4 (four) hours as needed for wheezing or shortness of breath. 07/23/23   [provider]  amLODipine (NORVASC) 10 MG tablet Take 10 mg by mouth daily.    [provider]  aspirin EC 81 MG tablet Take 81 mg by mouth daily.    [provider]  cetirizine  (ZYRTEC ) 10 MG tablet Take 1 tablet (10 mg total) by mouth daily. Patient not taking: Reported on 02/17/2024 12/23/23   Geronimo Amel, MD  cholecalciferol (VITAMIN D3) 25 MCG (1000 UNIT) tablet Take 1,000 Units by mouth daily.    [provider]  clotrimazole (LOTRIMIN) 1 % external solution Apply 1 Application topically 2 (two) times daily. 09/23/23   [provider]  fluticasone  (FLONASE ) 50 MCG/ACT nasal spray 1 spray each nostril twice a day until symptoms improve, then can decrease to 1 spray each nostril once a day Patient not taking: Reported on 02/17/2024 12/23/23   Ramaswamy, Murali, MD  hydrochlorothiazide (HYDRODIURIL) 25 MG tablet Take 25 mg by mouth daily.    [provider]  ibuprofen  (ADVIL ) 200 MG tablet Take 400 mg by mouth daily.    [provider]  Multiple Vitamin (MULTIVITAMIN WITH MINERALS) TABS Take 1 tablet by mouth daily.    [provider]  vitamin B-12 (CYANOCOBALAMIN) 100 MCG tablet Take 100 mcg by mouth daily.    [provider]    Allergies: Patient has no known allergies.    Review of Systems  Musculoskeletal:  Positive for neck pain.    Updated Vital Signs BP (!) 149/101 (BP Location: Left Arm)   Pulse 91   Temp 98.2 F (36.8 C) (Oral)   Resp 18   Ht 6' (1.829 m)   Wt 111.1 kg   SpO2 96%   BMI 33.23 kg/m   Physical Exam Vitals and nursing note reviewed.  Constitutional:      General: He is not in acute distress.    Appearance: Normal appearance. He is not ill-appearing.  HENT:     Head: Normocephalic and atraumatic.  Eyes:     General: Lids are normal. Vision grossly intact. No visual field deficit.    Extraocular Movements: Extraocular movements intact.     Right eye: Normal extraocular motion and no nystagmus.     Left eye: Normal extraocular motion and no nystagmus.     Conjunctiva/sclera: Conjunctivae normal.     Pupils: Pupils are equal, round, and reactive to light.  Cardiovascular:     Rate and Rhythm: Normal rate.     Heart sounds: Normal heart sounds.  Pulmonary:     Effort:  Pulmonary effort is normal. No respiratory distress.     Breath sounds: Normal breath sounds.  Musculoskeletal:     Cervical back: Normal range of motion and neck supple. No rigidity.  Skin:    Coloration: Skin is not jaundiced or pale.  Neurological:     General: No focal deficit present.     Mental Status: He is alert and oriented to person, place, and time. Mental status is at baseline.     GCS: GCS eye subscore is 4. GCS verbal subscore is 5. GCS motor subscore is 6.     Cranial Nerves: No cranial nerve deficit, dysarthria or facial asymmetry.     Sensory: No sensory deficit.     Motor: No weakness, abnormal muscle tone, seizure activity or pronator drift.     Coordination:  Coordination normal. Finger-Nose-Finger Test and Heel to Shin Test normal.     Gait: Gait normal.     Deep Tendon Reflexes: Reflexes normal.     (all labs ordered are listed, but only abnormal results are displayed) Labs Reviewed - No data to display  EKG: None  Radiology: No results found.   Medications Ordered in the ED - No data to display                                  Medical Decision Making  Patient presents to the ED for concern of neck pain, this involves an extensive number of treatment options, and is a complaint that carries with it a high risk of complications and morbidity.  The differential diagnosis includes torticollis, dystonic reaction, epidural abscess, DDD, disc herniation, cervical radiculopathy, muscle strain   Co morbidities that complicate the patient evaluation  See HPI   Additional history obtained:  Additional history obtained from Nursing   External records from outside source obtained and reviewed including triage note     Problem List / ED Course:  Muscle strain Appears resolved.  No complaints of neck pain.  ROM of neck WNL.  No signs of nuchal rigidity. Vital signs WNL with no fever nor tachycardia.  No complaints of fever nor chills Neuroexam without focal deficits, weakness, nor paresthesia.  Able to ambulate without difficulty Do not see any contraindications to returning to work with normal physical exam, neuro exam.   Reevaluation:  After the interventions noted above, I reevaluated the patient and found that they have :stayed the same   Social Determinants of Health:  Current tobacco abuse Has PCP through [place]   Dispostion:  After consideration of the diagnostic results and the patients response to treatment, I feel that the patent would benefit from outpatient management symptomatic care.  Discussed ED workup, disposition, return to ED precautions with patient who expresses understanding agrees with plan.  All  questions answered to their satisfaction.  They are agreeable to plan.  Discharge instructions provided on paperwork  Final diagnoses:  Neck pain  Muscle strain    ED Discharge Orders     None        Minnie Tinnie BRAVO, PA 06/17/24 1918    Albertina Dixon, MD 06/17/24 [phone]

## 2024-06-17 NOTE — ED Provider Triage Note (Signed)
 Emergency Medicine Provider Triage Evaluation Note  Derrick Foster , a 63 y.o. male  was evaluated in triage.  Pt requests work note to be able to go back to work.  States he had a neck strain back 2 weeks ago but this has fully resolved.  Denies any complaints currently.  Denies any headache, nausea, vomiting, vision changes.  Denies any fever or chills.  Review of Systems  Positive: As above Negative: As above  Physical Exam  BP (!) 136/99 (BP Location: Right Arm)   Pulse (!) 109   Temp 98.3 F (36.8 C)   Resp 18   Ht 6' (1.829 m)   Wt 111.1 kg   SpO2 95%   BMI 33.23 kg/m  Gen:   Awake, no distress   Resp:  Normal effort  MSK:   Moves extremities without difficulty    Medical Decision Making  Medically screening exam initiated at 2:32 PM.  Appropriate orders placed.  Derrick Foster was informed that the remainder of the evaluation will be completed by another provider, this initial triage assessment does not replace that evaluation, and the importance of remaining in the ED until their evaluation is complete.     Veta Palma, PA-C 06/17/24 1434

## 2024-06-17 NOTE — ED Triage Notes (Addendum)
 Patient arrives ambulatory by POV c/o neck strain going for past few days. Denies any injury.   Patient also asking for a provider to sign papers to be released back to work.

## 2024-06-17 NOTE — ED Notes (Addendum)
Pt states that he did not take his BP medication today.

## 2024-06-17 NOTE — Discharge Instructions (Signed)
 Thank you for letting us  evaluate you today.  We have filled out your work note.  Please continue using NSAIDs, ice, heat, rest, muscle relaxers, stretches.  Return to Emergency Department if you experience numbness, weakness in one-sided body, neck rigidity especially with fever

## 2024-07-08 ENCOUNTER — Other Ambulatory Visit (HOSPITAL_COMMUNITY): Payer: Self-pay | Admitting: Internal Medicine

## 2024-07-08 DIAGNOSIS — M549 Dorsalgia, unspecified: Secondary | ICD-10-CM

## 2024-07-08 DIAGNOSIS — M542 Cervicalgia: Secondary | ICD-10-CM

## 2024-07-14 ENCOUNTER — Ambulatory Visit (HOSPITAL_COMMUNITY)
Admission: RE | Admit: 2024-07-14 | Discharge: 2024-07-14 | Disposition: A | Discharge status: Home / Self Care | Source: Ambulatory Visit | Attending: Internal Medicine

## 2024-07-14 ENCOUNTER — Ambulatory Visit (HOSPITAL_COMMUNITY)
Admission: RE | Admit: 2024-07-14 | Discharge: 2024-07-14 | Disposition: A | Discharge status: Home / Self Care | Source: Ambulatory Visit | Attending: Internal Medicine | Admitting: Internal Medicine

## 2024-07-14 DIAGNOSIS — M542 Cervicalgia: Secondary | ICD-10-CM | POA: Diagnosis present

## 2024-07-14 DIAGNOSIS — M47814 Spondylosis without myelopathy or radiculopathy, thoracic region: Secondary | ICD-10-CM | POA: Diagnosis not present

## 2024-07-14 DIAGNOSIS — M47812 Spondylosis without myelopathy or radiculopathy, cervical region: Secondary | ICD-10-CM

## 2024-07-14 DIAGNOSIS — M549 Dorsalgia, unspecified: Secondary | ICD-10-CM | POA: Insufficient documentation

## 2024-08-18 ENCOUNTER — Other Ambulatory Visit (HOSPITAL_COMMUNITY): Payer: Self-pay | Admitting: Physician Assistant

## 2024-08-18 DIAGNOSIS — F17210 Nicotine dependence, cigarettes, uncomplicated: Secondary | ICD-10-CM

## 2024-08-25 ENCOUNTER — Encounter (HOSPITAL_COMMUNITY): Payer: Self-pay

## 2024-08-25 ENCOUNTER — Ambulatory Visit (HOSPITAL_COMMUNITY)

## 2024-08-30 NOTE — Therapy (Signed)
 OUTPATIENT PHYSICAL THERAPY CERVICAL EVALUATION   Patient Name: Derrick Foster MRN: [id number] DOB:Feb 17, 1961, 63 y.o., male Today's Date: 08/31/2024  END OF SESSION:  PT End of Session - 08/31/24 1529     Visit Number 1    Number of Visits 16    Date for Recertification  10/31/24    Authorization Type cigna    PT Start Time 0330    PT Stop Time 0415    PT Time Calculation (min) 45 min    Activity Tolerance Patient tolerated treatment well          Past Medical History:  Diagnosis Date   Hypertension    History reviewed. No pertinent surgical history. 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 Has been out of work since July 18th.    PERTINENT HISTORY:  N/a  PAIN:  0/10 currently, 1/10W, 0B Are you having pain? Yes: NPRS scale: 0 Pain location: R UT/MT Pain description: sharp Aggravating factors: specific movements Relieving factors: rest, muscle relaxers   PRECAUTIONS: None  RED FLAGS: None     WEIGHT BEARING RESTRICTIONS: No  FALLS:  Has patient fallen in last 6 months? No   OCCUPATION: forklift driver  PLOF: Independent  PATIENT GOALS: get back to being able to work (currently 85%), decrease pain   OBJECTIVE:   Note: Objective measures were completed at Evaluation unless otherwise noted.    PATIENT SURVEYS:  NDI:  NECK DISABILITY INDEX  Date: 10/13 Score  Pain intensity   2. Personal care (washing, dressing, etc.)   3. Lifting   4. Reading   5. Headaches   6. Concentration   7. Work   8. Driving   9. Sleeping   10. Recreation   Total 5/50   Minimum Detectable Change (90% confidence): 5 points or 10% points  COGNITION:  Overall cognitive status: Within functional limits for tasks assessed  SENSATION: WFL  POSTURE: rounded shoulders and forward head  PALPATION: N/a (usual pain of TTP is around right periscapular area in UT/MT)  CERVICAL ROM:   Active ROM A/PROM (deg) eval  Flexion 30  Extension 60  Right lateral flexion 35 stiffness  Left lateral flexion 35  Right rotation 45  Left rotation 45   (Blank rows = not tested)  UPPER EXTREMITY ROM:  Active ROM Right eval Left eval  Shoulder flexion wfl wfl  Shoulder extension    Shoulder abduction wfl wfl  Shoulder adduction    Shoulder extension    Shoulder internal rotation (arm behind back) Thumb Middle of scap thumb Thumb Bottom of scap  Shoulder external rotation wfl wfl  Elbow flexion    Elbow extension    Wrist flexion    Wrist extension    Wrist ulnar deviation    Wrist radial deviation    Wrist pronation    Wrist supination     (Blank rows = not tested)  UPPER EXTREMITY MMT:  MMT Right eval Left eval  Shoulder flexion 4- 4-  Shoulder extension    Shoulder abduction 4 4  Shoulder adduction    Shoulder extension    Shoulder internal rotation 4+ 4+  Shoulder external rotation 4- 4-  Middle trapezius    Lower trapezius    Elbow flexion    Elbow extension    Wrist flexion    Wrist extension    Wrist ulnar deviation    Wrist radial deviation    Wrist pronation    Wrist supination    Grip strength     (Blank rows = not tested)  CERVICAL SPECIAL TESTS:  Spurling's test:  Negative    TREATMENT DATE:                                                                                                                               TREATMENT 08/31/2024:  Therapeutic Exercise: - green tb shrug x8x3s -green tb row x8x3s -green tb d2 flexion x8x3s    Therapeutic Activity: - Patient educated on HEP, POC, prognosis, and relevant tissues/anatomy.        PATIENT EDUCATION:  Education details: HEP Person educated: Patient Education method: Programmer, multimedia, Facilities manager, Verbal cues, and Handouts Education comprehension: verbalized understanding and returned demonstration  HOME EXERCISE PROGRAM: 5x/week, 2x/day green tb seated shrug 2x8x3s green tb standing row 2x8x3s green tb seated d2 flexion 2x8x3s   ASSESSMENT:  CLINICAL IMPRESSION: Patient is a 63 y.o. male who was seen today for physical therapy evaluation and treatment for cervicalgia of right side. Current deficits include: Unexplained pain during arm ROM that affects working, general BL UE weakness, and general decreased activity tolerance due to being out of work. As a result, patient would benefit from skilled PT to address aforementioned deficits via plan below.  OBJECTIVE IMPAIRMENTS: decreased strength and pain.  PARTICIPATION LIMITATIONS: occupation   REHAB POTENTIAL: Good  CLINICAL DECISION MAKING: Stable/uncomplicated  EVALUATION COMPLEXITY: Low   GOALS: Goals reviewed with patient? No  SHORT TERM GOALS: Target date: 09/21/2024    Patient will decrease worst pain occurrence to 50% of original frequency to improve ADL completion and overall QOL  Baseline: 100% Goal status: INITIAL  2.  Patient will demonstrate 75% HEP compliance to show independence with self-management of condition  Baseline: 0% Goal status: INITIAL    LONG TERM GOALS: Target date: 10/31/24  Patient will demonstrate a 5 point improvement in NDI to show improvements in ADL completion and overall  QOL   Baseline: 5 Goal status: INITIAL  2.  Patient will demonstrate 100% HEP compliance to show independence with self-management of condition  Baseline: 0% Goal status: INITIAL  3.  Patient will be able to work at least 95% capacity to demonstrate ability to return to work  Baseline: 85% Goal status: INITIAL  4. Patient will decrease worst pain to 0/10 at most to improve ADL completion and overall QOL  Baseline: 1/10  Goal status: initial      PLAN:  PT FREQUENCY: 1-2x/week  PT DURATION: 8 weeks  PLANNED INTERVENTIONS: 97110-Therapeutic exercises, 97530- Therapeutic activity, 97112- Neuromuscular re-education, 97535- Self Care, [id number]- Manual therapy, and Patient/Family education  PLAN FOR NEXT SESSION: HEP assessment and progression, symptom modulation, and loading (isolated and/or functional). Manual therapy and NME training as needed.     [place] Odessia Scot  PT, DPT

## 2024-08-31 ENCOUNTER — Other Ambulatory Visit: Payer: Self-pay

## 2024-08-31 ENCOUNTER — Ambulatory Visit: Attending: Neurosurgery

## 2024-08-31 DIAGNOSIS — M6281 Muscle weakness (generalized): Secondary | ICD-10-CM | POA: Diagnosis present

## 2024-08-31 DIAGNOSIS — M542 Cervicalgia: Secondary | ICD-10-CM | POA: Diagnosis present

## 2024-09-09 ENCOUNTER — Ambulatory Visit

## 2024-09-09 ENCOUNTER — Ambulatory Visit (HOSPITAL_COMMUNITY)

## 2024-09-09 NOTE — Therapy (Incomplete)
 OUTPATIENT PHYSICAL THERAPY CERVICAL EVALUATION   Patient Name: Derrick Foster MRN: [id number] DOB:06-08-1961, 63 y.o., male Today's Date: 09/09/2024  END OF SESSION:    Past Medical History:  Diagnosis Date   Hypertension    No past surgical history on file. Patient Active Problem List   Diagnosis Date Noted   Strain of right hamstring 08/23/2020    PCP: [facility] P[facility] - General   REFERRING PROVIDER: Mavis Purchase, MD  REFERRING DIAG: M54.2 (ICD-10-CM) - Cervicalgia  THERAPY DIAG:  No diagnosis found.  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 Has been out of work since July 18th.    PERTINENT HISTORY:  N/a  PAIN:  0/10 currently, 1/10W, 0B Are you having pain? Yes: NPRS scale: 0 Pain location: R UT/MT Pain description: sharp Aggravating factors: specific movements Relieving factors: rest, muscle relaxers   PRECAUTIONS: None  RED FLAGS: None     WEIGHT BEARING RESTRICTIONS: No  FALLS:  Has patient fallen in last 6 months? No   OCCUPATION: forklift driver  PLOF: Independent  PATIENT GOALS: get back to being able to work (currently 85%), decrease pain   OBJECTIVE:  Note: Objective measures were completed at Evaluation unless otherwise noted.    PATIENT SURVEYS:  NDI:  NECK DISABILITY INDEX  Date: 10/13 Score  Pain intensity   2. Personal care (washing, dressing, etc.)   3. Lifting   4. Reading   5. Headaches   6. Concentration   7. Work   8. Driving   9. Sleeping   10.  Recreation   Total 5/50   Minimum Detectable Change (90% confidence): 5 points or 10% points  COGNITION: Overall cognitive status: Within functional limits for tasks assessed  SENSATION: WFL  POSTURE: rounded shoulders and forward head  PALPATION: N/a (usual pain of TTP is around right periscapular area in UT/MT)  CERVICAL ROM:   Active ROM A/PROM (deg) eval  Flexion 30  Extension 60  Right lateral flexion 35 stiffness  Left lateral flexion 35  Right rotation 45  Left rotation 45   (Blank rows = not tested)  UPPER EXTREMITY ROM:  Active ROM Right eval Left eval  Shoulder flexion wfl wfl  Shoulder extension    Shoulder abduction wfl wfl  Shoulder adduction    Shoulder extension    Shoulder internal rotation (arm behind back) Thumb Middle of scap thumb Thumb Bottom of scap  Shoulder external rotation wfl wfl  Elbow flexion    Elbow extension    Wrist flexion    Wrist extension    Wrist ulnar deviation    Wrist radial deviation    Wrist pronation    Wrist supination     (Blank rows = not tested)  UPPER EXTREMITY MMT:  MMT Right eval Left eval  Shoulder flexion 4- 4-  Shoulder extension    Shoulder abduction 4 4  Shoulder adduction    Shoulder extension    Shoulder internal rotation 4+ 4+  Shoulder external rotation 4- 4-  Middle trapezius    Lower trapezius    Elbow flexion    Elbow extension    Wrist flexion    Wrist extension    Wrist ulnar deviation    Wrist radial deviation    Wrist pronation    Wrist supination    Grip strength     (Blank rows = not tested)  CERVICAL SPECIAL TESTS:  Spurling's test: Negative    TREATMENT DATE:                                                                                                                               TREATMENT 08/31/2024:  Therapeutic Exercise: - green tb shrug x8x3s -green tb row x8x3s -green tb d2 flexion x8x3s    Therapeutic Activity: - Patient educated on HEP, POC,  prognosis, and relevant tissues/anatomy.        PATIENT EDUCATION:  Education details: HEP Person educated: Patient Education method: Programmer, multimedia, Facilities manager, Verbal cues, and Handouts Education comprehension: verbalized understanding and returned demonstration  HOME EXERCISE PROGRAM: 5x/week, 2x/day green tb seated shrug 2x8x3s green tb standing row 2x8x3s green tb seated d2 flexion 2x8x3s   ASSESSMENT:  CLINICAL IMPRESSION: Patient is a 63 y.o. male who was seen today for physical therapy evaluation and treatment for cervicalgia of right side. Current deficits include: Unexplained pain during arm ROM that affects working, general BL UE weakness, and general decreased activity tolerance due to being out of work. As a result, patient would benefit from skilled PT to address aforementioned deficits via plan below.  OBJECTIVE IMPAIRMENTS: decreased strength and pain.     PARTICIPATION LIMITATIONS: occupation   REHAB POTENTIAL: Good  CLINICAL DECISION MAKING: Stable/uncomplicated  EVALUATION COMPLEXITY: Low   GOALS: Goals reviewed with patient? No  SHORT TERM GOALS: Target date: 09/21/2024    Patient will decrease worst pain occurrence to 50% of original frequency to improve ADL completion and overall QOL  Baseline: 100% Goal status: INITIAL  2.  Patient will demonstrate 75% HEP compliance to show independence with self-management of condition  Baseline: 0% Goal status: INITIAL    LONG TERM GOALS: Target date: 10/31/24  Patient will demonstrate a 5 point improvement in NDI to show improvements in ADL completion and overall QOL   Baseline: 5 Goal status: INITIAL  2.  Patient will demonstrate 100% HEP compliance to show independence with self-management of condition  Baseline: 0% Goal status: INITIAL  3.  Patient will be able to work at least 95% capacity to demonstrate ability to return to work  Baseline: 85% Goal status: INITIAL  4. Patient will  decrease worst pain to 0/10 at most to improve ADL completion and overall QOL  Baseline: 1/10  Goal status: initial      PLAN:  PT FREQUENCY: 1-2x/week  PT DURATION: 8 weeks  PLANNED INTERVENTIONS: 97110-Therapeutic exercises, 97530- Therapeutic activity, 97112- Neuromuscular re-education, 97535- Self Care, [id number]- Manual therapy, and Patient/Family education  PLAN FOR NEXT SESSION: HEP assessment and progression, symptom modulation, and loading (isolated and/or functional). Manual therapy and NME training as needed.     [place] Odessia Scot  PT, DPT

## 2024-09-11 ENCOUNTER — Ambulatory Visit

## 2024-09-11 DIAGNOSIS — M6281 Muscle weakness (generalized): Secondary | ICD-10-CM

## 2024-09-11 DIAGNOSIS — M542 Cervicalgia: Secondary | ICD-10-CM

## 2024-09-11 NOTE — Therapy (Signed)
 OUTPATIENT PHYSICAL THERAPY CERVICAL EVALUATION   Patient Name: ASHAZ ROBLING MRN: [id number] DOB:[birth year], 63 y.o., male Today's Date: 09/11/2024  END OF SESSION:  PT End of Session - 09/11/24 1025     Visit Number 2    Number of Visits 16    Date for Recertification  10/31/24    Authorization Type cigna    PT Start Time 1015    PT Stop Time 1100    PT Time Calculation (min) 45 min    Activity Tolerance Patient tolerated treatment well           Past Medical History:  Diagnosis Date   Hypertension    History reviewed. No pertinent surgical history. Patient Active Problem List   Diagnosis Date Noted   Strain of right hamstring 08/23/2020    [facility]P: [facility] P[facility] - General   REFERRING PROVIDER: Mavis Purchase, MD  REFERRING DIAG: M54.2 (ICD-10-CM) - Cervicalgia  THERAPY DIAG:  Cervicalgia  Muscle weakness (generalized)  Rationale for Evaluation and Treatment: Rehabilitation  ONSET DATE: July 17th  SUBJECTIVE:                                                                                                                                                                                                         SUBJECTIVE STATEMENT: Going day to day. Haven't been able to do exercises because of other things this week.  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 Has been out of work since July 18th.    PERTINENT HISTORY:  N/a  PAIN:  0/10 currently, 1/10W, 0B Are you having pain? Yes: NPRS scale: 0 Pain location: R UT/MT Pain description: sharp Aggravating factors: specific movements Relieving factors: rest, muscle relaxers   PRECAUTIONS: None  RED FLAGS: None     WEIGHT BEARING RESTRICTIONS: No  FALLS:  Has patient fallen in last 6 months? No   OCCUPATION: forklift driver  PLOF:  Independent  PATIENT GOALS: get back to being able to work (currently 85%), decrease pain   OBJECTIVE:  Note: Objective measures were completed at Evaluation unless otherwise noted.    PATIENT SURVEYS:  NDI:  NECK DISABILITY INDEX  Date: 10/13 Score  Pain intensity   2. Personal care (washing, dressing, etc.)   3. Lifting   4. Reading   5. Headaches   6. Concentration   7. Work   8. Driving   9. Sleeping   10. Recreation  Total 5/50   Minimum Detectable Change (90% confidence): 5 points or 10% points  COGNITION: Overall cognitive status: Within functional limits for tasks assessed  SENSATION: WFL  POSTURE: rounded shoulders and forward head  PALPATION: N/a (usual pain of TTP is around right periscapular area in UT/MT)  CERVICAL ROM:   Active ROM A/PROM (deg) eval  Flexion 30  Extension 60  Right lateral flexion 35 stiffness  Left lateral flexion 35  Right rotation 45  Left rotation 45   (Blank rows = not tested)  UPPER EXTREMITY ROM:  Active ROM Right eval Left eval  Shoulder flexion wfl wfl  Shoulder extension    Shoulder abduction wfl wfl  Shoulder adduction    Shoulder extension    Shoulder internal rotation (arm behind back) Thumb Middle of scap thumb Thumb Bottom of scap  Shoulder external rotation wfl wfl  Elbow flexion    Elbow extension    Wrist flexion    Wrist extension    Wrist ulnar deviation    Wrist radial deviation    Wrist pronation    Wrist supination     (Blank rows = not tested)  UPPER EXTREMITY MMT:  MMT Right eval Left eval  Shoulder flexion 4- 4-  Shoulder extension    Shoulder abduction 4 4  Shoulder adduction    Shoulder extension    Shoulder internal rotation 4+ 4+  Shoulder external rotation 4- 4-  Middle trapezius    Lower trapezius    Elbow flexion    Elbow extension    Wrist flexion    Wrist extension    Wrist ulnar deviation    Wrist radial deviation    Wrist pronation    Wrist supination     Grip strength     (Blank rows = not tested)  CERVICAL SPECIAL TESTS:  Spurling's test: Negative    TREATMENT DATE:                TREATMENT 09/11/2024:  Therapeutic Exercise:  - green tb shrug 2x8x3s -green tb row 2x8x3s -green tb d2 flexion BL2 2x8x3s -SA Foam roll 2x8x3s Seated kb press 25# 2x6  -green TB ER 2x8x3s BL  Therapeutic Activity: HEP reassessment and update  Did not do: Shoulder abd Vertical pulling                                                                                                                           TREATMENT 08/31/2024:  Therapeutic Exercise:  - green tb shrug x8x3s -green tb row x8x3s -green tb d2 flexion x8x3s    Therapeutic Activity: - Patient educated on HEP, POC, prognosis, and relevant tissues/anatomy.        PATIENT EDUCATION:  Education details: HEP Person educated: Patient Education method: Programmer, multimedia, Facilities manager, Verbal cues, and Handouts Education comprehension: verbalized understanding and returned demonstration  HOME EXERCISE PROGRAM: 5x/week, 2x/day green tb seated shrug 2x8x3s green tb standing row 2x8x3s green tb seated d2 flexion 2x8x3s  ASSESSMENT:  CLINICAL IMPRESSION: Patient tolerated treatment with no increases in pain with progressions in BL upper back, and periscapular loading. Current deficits include: activity tolerance, functional UE strength. As a result, patient would continue to benefit from skilled PT to address said deficits via plan below.     EVAL: Patient is a 63 y.o. male who was seen today for physical therapy evaluation and treatment for cervicalgia of right side. Current deficits include: Unexplained pain during arm ROM that affects working, general BL UE weakness, and general decreased activity tolerance due to being out of work. As a result, patient would benefit from skilled PT to address aforementioned deficits via plan below.  OBJECTIVE IMPAIRMENTS:  decreased strength and pain.     PARTICIPATION LIMITATIONS: occupation   REHAB POTENTIAL: Good  CLINICAL DECISION MAKING: Stable/uncomplicated  EVALUATION COMPLEXITY: Low   GOALS: Goals reviewed with patient? No  SHORT TERM GOALS: Target date: 09/21/2024    Patient will decrease worst pain occurrence to 50% of original frequency to improve ADL completion and overall QOL  Baseline: 100% Goal status: INITIAL  2.  Patient will demonstrate 75% HEP compliance to show independence with self-management of condition  Baseline: 0% Goal status: INITIAL    LONG TERM GOALS: Target date: 10/31/24  Patient will demonstrate a 5 point improvement in NDI to show improvements in ADL completion and overall QOL   Baseline: 5 Goal status: INITIAL  2.  Patient will demonstrate 100% HEP compliance to show independence with self-management of condition  Baseline: 0% Goal status: INITIAL  3.  Patient will be able to work at least 95% capacity to demonstrate ability to return to work  Baseline: 85% Goal status: INITIAL  4. Patient will decrease worst pain to 0/10 at most to improve ADL completion and overall QOL  Baseline: 1/10  Goal status: initial      PLAN:  PT FREQUENCY: 1-2x/week  PT DURATION: 8 weeks  PLANNED INTERVENTIONS: 97110-Therapeutic exercises, 97530- Therapeutic activity, 97112- Neuromuscular re-education, 97535- Self Care, 02859- Manual therapy, and Patient/Family education  PLAN FOR NEXT SESSION: HEP assessment and progression, symptom modulation, and loading (isolated and/or functional). Manual therapy and NME training as needed.     [place] Odessia Scot  PT, DPT

## 2024-09-15 ENCOUNTER — Ambulatory Visit

## 2024-09-15 DIAGNOSIS — M542 Cervicalgia: Secondary | ICD-10-CM

## 2024-09-15 DIAGNOSIS — M6281 Muscle weakness (generalized): Secondary | ICD-10-CM

## 2024-09-15 NOTE — Therapy (Signed)
 OUTPATIENT PHYSICAL THERAPY CERVICAL EVALUATION   Patient Name: Derrick Foster MRN: [id number] DOB:08-28-1961, 63 y.o., male Today's Date: 09/15/2024  END OF SESSION:  PT End of Session - 09/15/24 0919     Visit Number 3    Number of Visits 16    Date for Recertification  10/31/24    Authorization Type cigna    PT Start Time [phone]    PT Stop Time 0930    PT Time Calculation (min) 45 min    Activity Tolerance Patient tolerated treatment well            Past Medical History:  Diagnosis Date   Hypertension    History reviewed. No pertinent surgical history. Patient Active Problem List   Diagnosis Date Noted   Strain of right hamstring 08/23/2020    [facility]P: [facility] P[facility] - General   REFERRING PROVIDER: Mavis Purchase, MD  REFERRING DIAG: M54.2 (ICD-10-CM) - Cervicalgia  THERAPY DIAG:  Cervicalgia  Muscle weakness (generalized)  Rationale for Evaluation and Treatment: Rehabilitation  ONSET DATE: July 17th  SUBJECTIVE:                                                                                                                                                                                                         SUBJECTIVE STATEMENT: Feeling pretty good today. Reports doing HEP and not really feeling any pain other than the occasional twinge in R back.   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 Has been out of work since July 18th.    PERTINENT HISTORY:  N/a  PAIN:  0/10 currently, 1/10W, 0B Are you having pain? Yes: NPRS scale: 0 Pain location: R UT/MT Pain description: sharp Aggravating factors: specific movements Relieving factors: rest, muscle relaxers   PRECAUTIONS: None  RED FLAGS: None     WEIGHT BEARING RESTRICTIONS: No  FALLS:  Has patient fallen in last 6 months? No   OCCUPATION:  forklift driver  PLOF: Independent  PATIENT GOALS: get back to being able to work (currently 85%), decrease pain   OBJECTIVE:  Note: Objective measures were completed at Evaluation unless otherwise noted.    PATIENT SURVEYS:  NDI:  NECK DISABILITY INDEX  Date: 10/13 Score  Pain intensity   2. Personal care (washing, dressing, etc.)   3. Lifting   4. Reading   5. Headaches   6. Concentration   7. Work   8. Driving  9. Sleeping   10. Recreation   Total 5/50   Minimum Detectable Change (90% confidence): 5 points or 10% points  COGNITION: Overall cognitive status: Within functional limits for tasks assessed  SENSATION: WFL  POSTURE: rounded shoulders and forward head R scapular winging  PALPATION: N/a (usual pain of TTP is around right periscapular area in UT/MT)  CERVICAL ROM:   Active ROM A/PROM (deg) eval  Flexion 30  Extension 60  Right lateral flexion 35 stiffness  Left lateral flexion 35  Right rotation 45  Left rotation 45   (Blank rows = not tested)  UPPER EXTREMITY ROM:  Active ROM Right eval Left eval  Shoulder flexion wfl wfl  Shoulder extension    Shoulder abduction wfl wfl  Shoulder adduction    Shoulder extension    Shoulder internal rotation (arm behind back) Thumb Middle of scap thumb Thumb Bottom of scap  Shoulder external rotation wfl wfl  Elbow flexion    Elbow extension    Wrist flexion    Wrist extension    Wrist ulnar deviation    Wrist radial deviation    Wrist pronation    Wrist supination     (Blank rows = not tested)  UPPER EXTREMITY MMT:  MMT Right eval Left eval  Shoulder flexion 4- 4-  Shoulder extension    Shoulder abduction 4 4  Shoulder adduction    Shoulder extension    Shoulder internal rotation 4+ 4+  Shoulder external rotation 4- 4-  Middle trapezius    Lower trapezius    Elbow flexion    Elbow extension    Wrist flexion    Wrist extension    Wrist ulnar deviation    Wrist radial deviation     Wrist pronation    Wrist supination    Grip strength     (Blank rows = not tested)  CERVICAL SPECIAL TESTS:  Spurling's test: Negative    TREATMENT DATE:             TREATMENT 09/15/2024:  Therapeutic Exercise:  Seated black TB shrugs 2x8x3s Black TB d2 flexion 2x8x3s Black TB row (cuing on protraction and retraction) Standing barrel hug 2x8x3s Black Foam roll x10x3s Green ER BL x10x3s Seated kb Y raise 5# BL x8x3s  Did not do: seated shoulder press, vertical  Therapeutic Activity: HEP reassessment and update      TREATMENT 09/11/2024:  Therapeutic Exercise:  - green tb shrug 2x8x3s -green tb row 2x8x3s -green tb d2 flexion BL2 2x8x3s -SA Foam roll 2x8x3s Seated kb press 25# 2x6  -green TB ER 2x8x3s BL  Therapeutic Activity: HEP reassessment and update  Did not do: Shoulder abd Vertical pulling                                                                                                                           TREATMENT 08/31/2024:  Therapeutic Exercise:  - green tb shrug x8x3s -green tb row x8x3s -green tb d2  flexion x8x3s    Therapeutic Activity: - Patient educated on HEP, POC, prognosis, and relevant tissues/anatomy.        PATIENT EDUCATION:  Education details: HEP Person educated: Patient Education method: Programmer, Multimedia, Facilities Manager, Verbal cues, and Handouts Education comprehension: verbalized understanding and returned demonstration  HOME EXERCISE PROGRAM: 5x/week, 2x/day green tb seated shrug 2x8x3s green tb standing row 2x8x3s green tb seated d2 flexion 2x8x3s   ASSESSMENT:  CLINICAL IMPRESSION: Patient tolerated treatment with no increases in pain with progressions in BL upper back, and periscapular loading. Current deficits include: activity tolerance, functional UE strength. As a result, patient would continue to benefit from skilled PT to address said deficits via plan below.     EVAL: Patient is a 63  y.o. male who was seen today for physical therapy evaluation and treatment for cervicalgia of right side. Current deficits include: Unexplained pain during arm ROM that affects working, general BL UE weakness, and general decreased activity tolerance due to being out of work. As a result, patient would benefit from skilled PT to address aforementioned deficits via plan below.  OBJECTIVE IMPAIRMENTS: decreased strength and pain.     PARTICIPATION LIMITATIONS: occupation   REHAB POTENTIAL: Good  CLINICAL DECISION MAKING: Stable/uncomplicated  EVALUATION COMPLEXITY: Low   GOALS: Goals reviewed with patient? No  SHORT TERM GOALS: Target date: 09/21/2024    Patient will decrease worst pain occurrence to 50% of original frequency to improve ADL completion and overall QOL  Baseline: 100% Goal status: INITIAL  2.  Patient will demonstrate 75% HEP compliance to show independence with self-management of condition  Baseline: 0% Goal status: INITIAL    LONG TERM GOALS: Target date: 10/31/24  Patient will demonstrate a 5 point improvement in NDI to show improvements in ADL completion and overall QOL   Baseline: 5 Goal status: INITIAL  2.  Patient will demonstrate 100% HEP compliance to show independence with self-management of condition  Baseline: 0% Goal status: INITIAL  3.  Patient will be able to work at least 95% capacity to demonstrate ability to return to work  Baseline: 85% Goal status: INITIAL  4. Patient will decrease worst pain to 0/10 at most to improve ADL completion and overall QOL  Baseline: 1/10  Goal status: initial      PLAN:  PT FREQUENCY: 1-2x/week  PT DURATION: 8 weeks  PLANNED INTERVENTIONS: 97110-Therapeutic exercises, 97530- Therapeutic activity, 97112- Neuromuscular re-education, 97535- Self Care, 02859- Manual therapy, and Patient/Family education  PLAN FOR NEXT SESSION: HEP assessment and progression, symptom modulation, and loading  (isolated and/or functional). Manual therapy and NME training as needed.     [place] Odessia Scot  PT, DPT

## 2024-09-17 ENCOUNTER — Ambulatory Visit

## 2024-09-22 ENCOUNTER — Ambulatory Visit: Attending: Neurosurgery

## 2024-09-22 DIAGNOSIS — M6281 Muscle weakness (generalized): Secondary | ICD-10-CM | POA: Insufficient documentation

## 2024-09-22 DIAGNOSIS — M542 Cervicalgia: Secondary | ICD-10-CM | POA: Diagnosis present

## 2024-09-22 NOTE — Therapy (Addendum)
 OUTPATIENT PHYSICAL THERAPY CERVICAL EVALUATION   Patient Name: Derrick Foster MRN: [id number] DOB:08/21/1961, 63 y.o., male Today's Date: 09/22/2024  END OF SESSION:  PT End of Session - 09/22/24 0836     Visit Number 4    Number of Visits 16    Date for Recertification  10/31/24    Authorization Type cigna    PT Start Time 0840    PT Stop Time 0925    PT Time Calculation (min) 45 min    Activity Tolerance Patient tolerated treatment well            Past Medical History:  Diagnosis Date   Hypertension    History reviewed. No pertinent surgical history. Patient Active Problem List   Diagnosis Date Noted   Strain of right hamstring 08/23/2020    PCP: [facility] P[facility] - General   REFERRING PROVIDER: Mavis Purchase, MD  REFERRING DIAG: M54.2 (ICD-10-CM) - Cervicalgia  THERAPY DIAG:  Cervicalgia  Muscle weakness (generalized)  Rationale for Evaluation and Treatment: Rehabilitation  ONSET DATE: July 17th  SUBJECTIVE:                                                                                                                                                                                                         SUBJECTIVE STATEMENT: Feeling pretty good today. Doing HEP with diminished frequency due to a lot going on in general. 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 Has been out of work since July 18th.    PERTINENT HISTORY:  N/a  PAIN:  0/10 currently, 3/10W, 0B Are you having pain? Yes: NPRS scale: 0 Pain location: R UT/MT Pain description: sharp Aggravating factors: specific movements Relieving factors: rest, muscle relaxers   PRECAUTIONS: None  RED FLAGS: None     WEIGHT BEARING RESTRICTIONS: No  FALLS:   Has patient fallen in last 6 months? No   OCCUPATION: forklift driver  PLOF: Independent  PATIENT GOALS: get back to being able to work (currently 85%), decrease pain   OBJECTIVE:  Note: Objective measures were completed at Evaluation unless otherwise noted.    PATIENT SURVEYS:  NDI:  NECK DISABILITY INDEX  Date: 10/13 Score  Pain intensity   2. Personal care (washing, dressing, etc.)   3. Lifting   4. Reading   5. Headaches   6.  Concentration   7. Work   8. Driving   9. Sleeping   10. Recreation   Total 5/50   Minimum Detectable Change (90% confidence): 5 points or 10% points  COGNITION: Overall cognitive status: Within functional limits for tasks assessed  SENSATION: WFL  POSTURE: rounded shoulders and forward head R scapular winging  PALPATION: N/a (usual pain of TTP is around right periscapular area in UT/MT)  CERVICAL ROM:   Active ROM A/PROM (deg) eval  Flexion 30  Extension 60  Right lateral flexion 35 stiffness  Left lateral flexion 35  Right rotation 45  Left rotation 45   (Blank rows = not tested)  UPPER EXTREMITY ROM:  Active ROM Right eval Left eval  Shoulder flexion wfl wfl  Shoulder extension    Shoulder abduction wfl wfl  Shoulder adduction    Shoulder extension    Shoulder internal rotation (arm behind back) Thumb Middle of scap thumb Thumb Bottom of scap  Shoulder external rotation wfl wfl  Elbow flexion    Elbow extension    Wrist flexion    Wrist extension    Wrist ulnar deviation    Wrist radial deviation    Wrist pronation    Wrist supination     (Blank rows = not tested)  UPPER EXTREMITY MMT:  MMT Right eval Left eval  Shoulder flexion 4- 4-  Shoulder extension    Shoulder abduction 4 4  Shoulder adduction    Shoulder extension    Shoulder internal rotation 4+ 4+  Shoulder external rotation 4- 4-  Middle trapezius    Lower trapezius    Elbow flexion    Elbow extension    Wrist flexion    Wrist  extension    Wrist ulnar deviation    Wrist radial deviation    Wrist pronation    Wrist supination    Grip strength     (Blank rows = not tested)  CERVICAL SPECIAL TESTS:  Spurling's test: Negative    TREATMENT DATE:  Treatment 09/22/24: Bent over needle thread x10x3s BL Standing barrel hug x10x3s BTB seated shrug 2x10x3s Bent over row 10# x8x3s R side (did not tolerate on left side) Black TB x8x3s Seated 15# kb press x10 Did not do: vertical pull  Therapeutic Activity: HEP reassessment and update   TREATMENT 09/15/2024: Therapeutic Exercise:  Seated black TB shrugs 2x8x3s Black TB d2 flexion 2x8x3s Black TB row (cuing on protraction and retraction) Standing barrel hug 2x8x3s Black Foam roll x10x3s Green ER BL x10x3s Seated kb Y raise 5# BL x8x3s  Did not do: seated shoulder press, vertical  Therapeutic Activity: HEP reassessment and update      TREATMENT 09/11/2024:  Therapeutic Exercise:  - green tb shrug 2x8x3s -green tb row 2x8x3s -green tb d2 flexion BL2 2x8x3s -SA Foam roll 2x8x3s Seated kb press 25# 2x6  -green TB ER 2x8x3s BL  Therapeutic Activity: HEP reassessment and update  Did not do: Shoulder abd Vertical pulling  TREATMENT 08/31/2024:  Therapeutic Exercise:  - green tb shrug x8x3s -green tb row x8x3s -green tb d2 flexion x8x3s    Therapeutic Activity: - Patient educated on HEP, POC, prognosis, and relevant tissues/anatomy.        PATIENT EDUCATION:  Education details: HEP Person educated: Patient Education method: Programmer, Multimedia, Facilities Manager, Verbal cues, and Handouts Education comprehension: verbalized understanding and returned demonstration  HOME EXERCISE PROGRAM: 5x/week, 2x/day green tb seated shrug 2x8x3s green tb standing row 2x8x3s green tb seated d2 flexion  2x8x3s   ASSESSMENT:  CLINICAL IMPRESSION: Patient tolerated treatment with no significant increases in pain with progressions in BL upper back, and periscapular loading. Current deficits include: excessive pain, activity tolerance, functional UE strength. As a result, patient would continue to benefit from skilled PT to address said deficits via plan below.     EVAL: Patient is a 63 y.o. male who was seen today for physical therapy evaluation and treatment for cervicalgia of right side. Current deficits include: Unexplained pain during arm ROM that affects working, general BL UE weakness, and general decreased activity tolerance due to being out of work. As a result, patient would benefit from skilled PT to address aforementioned deficits via plan below.  OBJECTIVE IMPAIRMENTS: decreased strength and pain.     PARTICIPATION LIMITATIONS: occupation   REHAB POTENTIAL: Good  CLINICAL DECISION MAKING: Stable/uncomplicated  EVALUATION COMPLEXITY: Low   GOALS: Goals reviewed with patient? No  SHORT TERM GOALS: Target date: 09/21/2024    Patient will decrease worst pain occurrence to 50% of original frequency to improve ADL completion and overall QOL  Baseline: 100% Goal status: INITIAL  2.  Patient will demonstrate 75% HEP compliance to show independence with self-management of condition  Baseline: 0% Goal status: INITIAL    LONG TERM GOALS: Target date: 10/31/24  Patient will demonstrate a 5 point improvement in NDI to show improvements in ADL completion and overall QOL   Baseline: 5 Goal status: INITIAL  2.  Patient will demonstrate 100% HEP compliance to show independence with self-management of condition  Baseline: 0% Goal status: INITIAL  3.  Patient will be able to work at least 95% capacity to demonstrate ability to return to work  Baseline: 85% Goal status: INITIAL  4. Patient will decrease worst pain to 0/10 at most to improve ADL completion and overall  QOL  Baseline: 1/10  Goal status: initial      PLAN:  PT FREQUENCY: 1-2x/week  PT DURATION: 8 weeks  PLANNED INTERVENTIONS: 97110-Therapeutic exercises, 97530- Therapeutic activity, 97112- Neuromuscular re-education, 97535- Self Care, [id number]- Manual therapy, and Patient/Family education  PLAN FOR NEXT SESSION: HEP assessment and progression, symptom modulation, and loading (isolated and/or functional). Manual therapy and NME training as needed. Primarily focusing on building MT strength/activity tolerance    [place] Odessia Scot  PT, DPT

## 2024-09-24 ENCOUNTER — Ambulatory Visit

## 2024-09-29 ENCOUNTER — Ambulatory Visit

## 2024-09-29 DIAGNOSIS — M542 Cervicalgia: Secondary | ICD-10-CM | POA: Diagnosis not present

## 2024-09-29 DIAGNOSIS — M6281 Muscle weakness (generalized): Secondary | ICD-10-CM

## 2024-09-29 NOTE — Therapy (Signed)
 OUTPATIENT PHYSICAL THERAPY CERVICAL EVALUATION   Patient Name: Derrick Foster MRN: [id number] DOB:[birth year], 63 y.o., male Today's Date: 09/29/2024  END OF SESSION:  PT End of Session - 09/29/24 0937     Visit Number 5    Number of Visits 16    Date for Recertification  10/31/24    Authorization Type cigna    PT Start Time [phone]    PT Stop Time 0930    PT Time Calculation (min) 45 min    Activity Tolerance Patient tolerated treatment well             Past Medical History:  Diagnosis Date   Hypertension    History reviewed. No pertinent surgical history. Patient Active Problem List   Diagnosis Date Noted   Strain of right hamstring 08/23/2020    [facility]P: [facility] P[facility] - General   REFERRING PROVIDER: Mavis Purchase, MD  REFERRING DIAG: M54.2 (ICD-10-CM) - Cervicalgia  THERAPY DIAG:  Cervicalgia  Muscle weakness (generalized)  Rationale for Evaluation and Treatment: Rehabilitation  ONSET DATE: July 17th  SUBJECTIVE:                                                                                                                                                                                                         SUBJECTIVE STATEMENT: Reports doing exercises as often as they can. Reports feeling twinge still with some specific movements.   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 Has been out of work since July 18th.    PERTINENT HISTORY:  N/a  PAIN:  0/10 currently, 3/10W, 0B Are you having pain? Yes: NPRS scale: 0 Pain location: R UT/MT Pain description: sharp Aggravating factors: specific movements Relieving factors: rest, muscle relaxers   PRECAUTIONS: None  RED FLAGS: None     WEIGHT BEARING RESTRICTIONS: No  FALLS:  Has patient fallen in last 6 months? No   OCCUPATION: forklift  driver  PLOF: Independent  PATIENT GOALS: get back to being able to work (currently 85%), decrease pain   OBJECTIVE:  Note: Objective measures were completed at Evaluation unless otherwise noted.    PATIENT SURVEYS:  NDI:  NECK DISABILITY INDEX  Date: 10/13 Score  Pain intensity   2. Personal care (washing, dressing, etc.)   3. Lifting   4. Reading   5. Headaches   6. Concentration   7. Work   8. Driving   9. Sleeping  10. Recreation   Total 5/50   Minimum Detectable Change (90% confidence): 5 points or 10% points  COGNITION: Overall cognitive status: Within functional limits for tasks assessed  SENSATION: WFL  POSTURE: rounded shoulders and forward head R scapular winging  PALPATION: N/a (usual pain of TTP is around right periscapular area in UT/MT)  CERVICAL ROM:   Active ROM A/PROM (deg) eval  Flexion 30  Extension 60  Right lateral flexion 35 stiffness  Left lateral flexion 35  Right rotation 45  Left rotation 45   (Blank rows = not tested)  UPPER EXTREMITY ROM:  Active ROM Right eval Left eval  Shoulder flexion wfl wfl  Shoulder extension    Shoulder abduction wfl wfl  Shoulder adduction    Shoulder extension    Shoulder internal rotation (arm behind back) Thumb Middle of scap thumb Thumb Bottom of scap  Shoulder external rotation wfl wfl  Elbow flexion    Elbow extension    Wrist flexion    Wrist extension    Wrist ulnar deviation    Wrist radial deviation    Wrist pronation    Wrist supination     (Blank rows = not tested)  UPPER EXTREMITY MMT:  MMT Right eval Left eval  Shoulder flexion 4- 4-  Shoulder extension    Shoulder abduction 4 4  Shoulder adduction    Shoulder extension    Shoulder internal rotation 4+ 4+  Shoulder external rotation 4- 4-  Middle trapezius    Lower trapezius    Elbow flexion    Elbow extension    Wrist flexion    Wrist extension    Wrist ulnar deviation    Wrist radial deviation     Wrist pronation    Wrist supination    Grip strength     (Blank rows = not tested)  CERVICAL SPECIAL TESTS:  Spurling's test: Negative    TREATMENT DATE:  Treatment 09/29/24 Therapeutic Exercise: Black TB shrug x8x3s, x4x3s, green TB x6x3s  5# BL shrug 2x5x3s Black TB row 2x4x3s GTB ER 2x4x3s BL  Therapeutic Activity HEP reassessment and update Pulldown 15# x2x3s (p!), 5# x8x3s Chest press 10# 2x6x3s    Treatment 09/22/24: Therapeutic Exercise:  Bent over needle thread x10x3s BL Standing barrel hug x10x3s BTB seated shrug 2x10x3s Bent over row 10# x8x3s R side (did not tolerate on left side) Black TB x8x3s Seated 15# kb press x10 Did not do: vertical pull  Therapeutic Activity: HEP reassessment and update   TREATMENT 09/15/2024: Therapeutic Exercise:  Seated black TB shrugs 2x8x3s Black TB d2 flexion 2x8x3s Black TB row (cuing on protraction and retraction) Standing barrel hug 2x8x3s Black Foam roll x10x3s Green ER BL x10x3s Seated kb Y raise 5# BL x8x3s  Did not do: seated shoulder press, vertical  Therapeutic Activity: HEP reassessment and update      TREATMENT 09/11/2024:  Therapeutic Exercise:  - green tb shrug 2x8x3s -green tb row 2x8x3s -green tb d2 flexion BL2 2x8x3s -SA Foam roll 2x8x3s Seated kb press 25# 2x6  -green TB ER 2x8x3s BL  Therapeutic Activity: HEP reassessment and update  Did not do: Shoulder abd Vertical pulling  TREATMENT 08/31/2024:  Therapeutic Exercise:  - green tb shrug x8x3s -green tb row x8x3s -green tb d2 flexion x8x3s    Therapeutic Activity: - Patient educated on HEP, POC, prognosis, and relevant tissues/anatomy.        PATIENT EDUCATION:  Education details: HEP Person educated: Patient Education method: Programmer, Multimedia, Facilities Manager, Verbal cues, and  Handouts Education comprehension: verbalized understanding and returned demonstration  HOME EXERCISE PROGRAM: 5x/week, 2x/day 2 soup can seated shrug x4x3s Black tb standing row x4x3s    ASSESSMENT:  CLINICAL IMPRESSION: Patient tolerated treatment with no significant increases in pain with progressions in BL upper back, and periscapular loading. Current deficits include: excessive pain, activity tolerance, functional UE strength. As a result, patient would continue to benefit from skilled PT to address said deficits via plan below.     EVAL: Patient is a 63 y.o. male who was seen today for physical therapy evaluation and treatment for cervicalgia of right side. Current deficits include: Unexplained pain during arm ROM that affects working, general BL UE weakness, and general decreased activity tolerance due to being out of work. As a result, patient would benefit from skilled PT to address aforementioned deficits via plan below.  OBJECTIVE IMPAIRMENTS: decreased strength and pain.     PARTICIPATION LIMITATIONS: occupation   REHAB POTENTIAL: Good  CLINICAL DECISION MAKING: Stable/uncomplicated  EVALUATION COMPLEXITY: Low   GOALS: Goals reviewed with patient? No  SHORT TERM GOALS: Target date: 09/21/2024    Patient will decrease worst pain occurrence to 50% of original frequency to improve ADL completion and overall QOL  Baseline: 100% Goal status: INITIAL  2.  Patient will demonstrate 75% HEP compliance to show independence with self-management of condition  Baseline: 0% Goal status: INITIAL    LONG TERM GOALS: Target date: 10/31/24  Patient will demonstrate a 5 point improvement in NDI to show improvements in ADL completion and overall QOL   Baseline: 5 Goal status: INITIAL  2.  Patient will demonstrate 100% HEP compliance to show independence with self-management of condition  Baseline: 0% Goal status: INITIAL  3.  Patient will be able to work at least  95% capacity to demonstrate ability to return to work  Baseline: 85% Goal status: INITIAL  4. Patient will decrease worst pain to 0/10 at most to improve ADL completion and overall QOL  Baseline: 1/10  Goal status: initial      PLAN:  PT FREQUENCY: 1-2x/week  PT DURATION: 8 weeks  PLANNED INTERVENTIONS: 97110-Therapeutic exercises, 97530- Therapeutic activity, 97112- Neuromuscular re-education, 97535- Self Care, [id number]- Manual therapy, and Patient/Family education  PLAN FOR NEXT SESSION: HEP assessment and progression, symptom modulation, and loading (isolated and/or functional). Manual therapy and NME training as needed. Primarily focusing on building MT strength/activity tolerance    [place] Odessia Scot  PT, DPT

## 2024-10-01 ENCOUNTER — Ambulatory Visit

## 2024-10-01 DIAGNOSIS — M542 Cervicalgia: Secondary | ICD-10-CM

## 2024-10-01 DIAGNOSIS — M6281 Muscle weakness (generalized): Secondary | ICD-10-CM

## 2024-10-01 NOTE — Therapy (Signed)
 OUTPATIENT PHYSICAL THERAPY CERVICAL EVALUATION   Patient Name: Derrick Foster MRN: [id number] DOB:May 25, 1961, 63 y.o., male Today's Date: 10/01/2024  END OF SESSION:  PT End of Session - 10/01/24 0919     Visit Number 6    Number of Visits 16    Date for Recertification  10/31/24    Authorization Type cigna    PT Start Time 0845    PT Stop Time 0925    PT Time Calculation (min) 40 min    Activity Tolerance Patient tolerated treatment well              Past Medical History:  Diagnosis Date   Hypertension    History reviewed. No pertinent surgical history. Patient Active Problem List   Diagnosis Date Noted   Strain of right hamstring 08/23/2020    [facility]P: [facility] P[facility] - General   REFERRING PROVIDER: Mavis Purchase, MD  REFERRING DIAG: M54.2 (ICD-10-CM) - Cervicalgia  THERAPY DIAG:  Cervicalgia  Muscle weakness (generalized)  Rationale for Evaluation and Treatment: Rehabilitation  ONSET DATE: July 17th  SUBJECTIVE:                                                                                                                                                                                                         SUBJECTIVE STATEMENT: Reports doing exercises and it doing well with minimal pain and discomfort. Feeling pretty good today.   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 Has been out of work since July 18th.    PERTINENT HISTORY:  N/a  PAIN:  0/10 currently, 3/10W, 0B Are you having pain? Yes: NPRS scale: 0 Pain location: R UT/MT Pain description: sharp Aggravating factors: specific movements Relieving factors: rest, muscle relaxers   PRECAUTIONS: None  RED FLAGS: None     WEIGHT BEARING RESTRICTIONS: No  FALLS:  Has patient fallen in last 6 months? No   OCCUPATION: forklift  driver  PLOF: Independent  PATIENT GOALS: get back to being able to work (currently 85%), decrease pain   OBJECTIVE:  Note: Objective measures were completed at Evaluation unless otherwise noted.    PATIENT SURVEYS:  NDI:  NECK DISABILITY INDEX  Date: 10/13 Score  Pain intensity   2. Personal care (washing, dressing, etc.)   3. Lifting   4. Reading   5. Headaches   6. Concentration   7. Work   8. Driving   9. Sleeping  10. Recreation   Total 5/50   Minimum Detectable Change (90% confidence): 5 points or 10% points  COGNITION: Overall cognitive status: Within functional limits for tasks assessed  SENSATION: WFL  POSTURE: rounded shoulders and forward head R scapular winging  PALPATION: N/a (usual pain of TTP is around right periscapular area in UT/MT)  CERVICAL ROM:   Active ROM A/PROM (deg) eval  Flexion 30  Extension 60  Right lateral flexion 35 stiffness  Left lateral flexion 35  Right rotation 45  Left rotation 45   (Blank rows = not tested)  UPPER EXTREMITY ROM:  Active ROM Right eval Left eval  Shoulder flexion wfl wfl  Shoulder extension    Shoulder abduction wfl wfl  Shoulder adduction    Shoulder extension    Shoulder internal rotation (arm behind back) Thumb Middle of scap thumb Thumb Bottom of scap  Shoulder external rotation wfl wfl  Elbow flexion    Elbow extension    Wrist flexion    Wrist extension    Wrist ulnar deviation    Wrist radial deviation    Wrist pronation    Wrist supination     (Blank rows = not tested)  UPPER EXTREMITY MMT:  MMT Right eval Left eval  Shoulder flexion 4- 4-  Shoulder extension    Shoulder abduction 4 4  Shoulder adduction    Shoulder extension    Shoulder internal rotation 4+ 4+  Shoulder external rotation 4- 4-  Middle trapezius    Lower trapezius    Elbow flexion    Elbow extension    Wrist flexion    Wrist extension    Wrist ulnar deviation    Wrist radial deviation     Wrist pronation    Wrist supination    Grip strength     (Blank rows = not tested)  CERVICAL SPECIAL TESTS:  Spurling's test: Negative    TREATMENT DATE:  10/01/24 Therapeutic Exercise:  Functional Reassessment Seated chin tuck x8x3s Seated cervical rotation x8x3s with chin tuck  5# BL shrug 2x8x3s GTB ER x6x3s BL    Therapeutic Activity HEP reassessment and update Black TB row x6x3s Chest press 20# 2x6x3s  Did not do:  Pulldown 15# x2x3s (p!), 5# x8x3s     Treatment 09/29/24 Therapeutic Exercise: Black TB shrug x8x3s, x4x3s, green TB x6x3s  5# BL shrug 2x5x3s Black TB row 2x4x3s GTB ER 2x4x3s BL  Therapeutic Activity HEP reassessment and update Pulldown 15# x2x3s (p!), 5# x8x3s Chest press 10# 2x6x3s    Treatment 09/22/24: Therapeutic Exercise:  Bent over needle thread x10x3s BL Standing barrel hug x10x3s BTB seated shrug 2x10x3s Bent over row 10# x8x3s R side (did not tolerate on left side) Black TB x8x3s Seated 15# kb press x10 Did not do: vertical pull  Therapeutic Activity: HEP reassessment and update   TREATMENT 09/15/2024: Therapeutic Exercise:  Seated black TB shrugs 2x8x3s Black TB d2 flexion 2x8x3s Black TB row (cuing on protraction and retraction) Standing barrel hug 2x8x3s Black Foam roll x10x3s Green ER BL x10x3s Seated kb Y raise 5# BL x8x3s  Did not do: seated shoulder press, vertical  Therapeutic Activity: HEP reassessment and update            PATIENT EDUCATION:  Education details: HEP Person educated: Patient Education method: Programmer, Multimedia, Demonstration, Verbal cues, and Handouts Education comprehension: verbalized understanding and returned demonstration  HOME EXERCISE PROGRAM: 5x/week, 2x/day 2 soup can seated shrug x4x3s Black tb standing row x4x3s Seated chin tuck  x8x3s  Do these as a warmup before working 2x/day (before work and after break)   ASSESSMENT:  CLINICAL IMPRESSION: Patient  tolerated treatment with no significant increases in pain with progressions in BL upper back, and periscapular loading. 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 Current deficits include: excessive pain, activity tolerance, functional UE strength. As a result, patient would continue to benefit from skilled PT to address said deficits via plan below.     EVAL: Patient is a 63 y.o. male who was seen today for physical therapy evaluation and treatment for cervicalgia of right side. Current deficits include: Unexplained pain during arm ROM that affects working, general BL UE weakness, and general decreased activity tolerance due to being out of work. As a result, patient would benefit from skilled PT to address aforementioned deficits via plan below.  OBJECTIVE IMPAIRMENTS: decreased strength and pain.     PARTICIPATION LIMITATIONS: occupation   REHAB POTENTIAL: Good  CLINICAL DECISION MAKING: Stable/uncomplicated  EVALUATION COMPLEXITY: Low   GOALS: Goals reviewed with patient? No  SHORT TERM GOALS: Target date: 09/21/2024    Patient will decrease worst pain occurrence to 50% of original frequency to improve ADL completion and overall QOL  Baseline: 100% Goal status: INITIAL  2.  Patient will demonstrate 75% HEP compliance to show independence with self-management of condition  Baseline: 0% Goal status: INITIAL    LONG TERM GOALS: Target date: 10/31/24  Patient will demonstrate a 5 point improvement in NDI to show improvements in ADL completion and overall QOL   Baseline: 5 Goal status: INITIAL  2.  Patient will demonstrate 100% HEP compliance to show independence with self-management of condition  Baseline: 0% Goal status: INITIAL  3.  Patient will be able to work at least 95% capacity to demonstrate ability to return to work  Baseline:  85% Goal status: INITIAL  4. Patient will decrease worst pain to 0/10 at most to improve ADL completion and overall QOL  Baseline: 1/10  Goal status: initial      PLAN:  PT FREQUENCY: 1-2x/week  PT DURATION: 8 weeks  PLANNED INTERVENTIONS: 97110-Therapeutic exercises, 97530- Therapeutic activity, 97112- Neuromuscular re-education, 97535- Self Care, [id number]- Manual therapy, and Patient/Family education  PLAN FOR NEXT SESSION: HEP assessment and progression, symptom modulation, and loading (isolated and/or functional). Manual therapy and NME training as needed. Primarily focusing on building MT strength/activity tolerance    [place] Odessia Scot  PT, DPT

## 2024-10-08 ENCOUNTER — Ambulatory Visit

## 2024-10-08 DIAGNOSIS — M542 Cervicalgia: Secondary | ICD-10-CM | POA: Diagnosis not present

## 2024-10-08 DIAGNOSIS — M6281 Muscle weakness (generalized): Secondary | ICD-10-CM

## 2024-10-08 NOTE — Therapy (Addendum)
 " OUTPATIENT PHYSICAL THERAPY CERVICAL EVALUATION  PHYSICAL THERAPY UNPLANNED DISCHARGE SUMMARY   Visits from Start of Care: 7  Current functional level related to goals / functional outcomes: Current status unknown   Remaining deficits: Current status unknown   Education / Equipment: Pt has not returned since visit listed below  Patient goals were not assessed. Patient is being discharged due to not returning since the last visit.  (the note below was addended to include the above D/C summary on 12/16/24)  Patient Name: Derrick Foster MRN: [id number] DOB:15-Apr-1961, 63 y.o., male Today's Date: 10/08/2024  END OF SESSION:  PT End of Session - 10/08/24 0844     Visit Number 7    Number of Visits 16    Date for Recertification  10/31/24    Authorization Type cigna    PT Start Time 0845    PT Stop Time 0925    PT Time Calculation (min) 40 min    Activity Tolerance Patient tolerated treatment well               Past Medical History:  Diagnosis Date   Hypertension    History reviewed. No pertinent surgical history. Patient Active Problem List   Diagnosis Date Noted   Strain of right hamstring 08/23/2020    [facility]P: [facility] PCP - General   REFERRING PROVIDER: Mavis Purchase, MD  REFERRING DIAG: M54.2 (ICD-10-CM) - Cervicalgia  THERAPY DIAG:  Cervicalgia  Muscle weakness (generalized)  Rationale for Evaluation and Treatment: Rehabilitation  ONSET DATE: July 17th  SUBJECTIVE:                                                                                                                                                                                                         SUBJECTIVE STATEMENT: Feeling alright today. 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 Has been out of work since July 18th.    PERTINENT HISTORY:  N/a  PAIN:  0/10 currently, 3/10W, 0B Are you having pain? Yes: NPRS scale: 0 Pain location: R UT/MT Pain description: sharp Aggravating factors: specific movements Relieving factors: rest, muscle relaxers   PRECAUTIONS: None  RED FLAGS: None     WEIGHT BEARING RESTRICTIONS: No  FALLS:  Has patient fallen in last 6 months?  No   OCCUPATION: forklift driver  PLOF: Independent  PATIENT GOALS: get back to being able to work (currently 85%), decrease pain   OBJECTIVE:  Note: Objective measures were completed at Evaluation unless otherwise noted.    PATIENT SURVEYS:  NDI:  NECK DISABILITY INDEX  Date: 10/13 Score  Pain intensity   2. Personal care (washing, dressing, etc.)   3. Lifting   4. Reading   5. Headaches   6. Concentration   7. Work   8. Driving   9. Sleeping   10. Recreation   Total 5/50   Minimum Detectable Change (90% confidence): 5 points or 10% points  COGNITION: Overall cognitive status: Within functional limits for tasks assessed  SENSATION: WFL  POSTURE: rounded shoulders and forward head R scapular winging  PALPATION: N/a (usual pain of TTP is around right periscapular area in UT/MT)  CERVICAL ROM:   Active ROM A/PROM (deg) eval  Flexion 30  Extension 60  Right lateral flexion 35 stiffness  Left lateral flexion 35  Right rotation 45  Left rotation 45   (Blank rows = not tested)  UPPER EXTREMITY ROM:  Active ROM Right eval Left eval  Shoulder flexion wfl wfl  Shoulder extension    Shoulder abduction wfl wfl  Shoulder adduction    Shoulder extension    Shoulder internal rotation (arm behind back) Thumb Middle of scap thumb Thumb Bottom of scap  Shoulder external rotation wfl wfl  Elbow flexion    Elbow extension    Wrist flexion    Wrist extension    Wrist ulnar  deviation    Wrist radial deviation    Wrist pronation    Wrist supination     (Blank rows = not tested)  UPPER EXTREMITY MMT:  MMT Right eval Left eval  Shoulder flexion 4- 4-  Shoulder extension    Shoulder abduction 4 4  Shoulder adduction    Shoulder extension    Shoulder internal rotation 4+ 4+  Shoulder external rotation 4- 4-  Middle trapezius    Lower trapezius    Elbow flexion    Elbow extension    Wrist flexion    Wrist extension    Wrist ulnar deviation    Wrist radial deviation    Wrist pronation    Wrist supination    Grip strength     (Blank rows = not tested)  CERVICAL SPECIAL TESTS:  Spurling's test: Negative    TREATMENT DATE:     10/08/24 Therapeutic Exercise:  Functional Reassessment Seated chin tuck x8x3s Seated cervical rotation x4x3s with chin tuck  5# BL shrug 2x6x3s GTB ER x8x3s BL    Therapeutic Activity HEP reassessment and update Black TB row 2x6x3s Chest press 20# 2x8x3s Lat pull down x4x3s 10#, x10x3s 5#  Did not do:  Shoulder press   10/01/24 Therapeutic Exercise:  Functional Reassessment Seated chin tuck x8x3s Seated cervical rotation x8x3s with chin tuck  5# BL shrug 2x8x3s GTB ER x6x3s BL    Therapeutic Activity HEP reassessment and update Black TB row x6x3s Chest press 20# 2x6x3s  Did not do:  Pulldown 15# x2x3s (p!), 5# x8x3s     Treatment 09/29/24 Therapeutic Exercise: Black TB shrug x8x3s, x4x3s, green TB x6x3s  5# BL shrug 2x5x3s Black TB row 2x4x3s GTB ER 2x4x3s BL  Therapeutic Activity HEP reassessment and update Pulldown 15# x2x3s (p!), 5# x8x3s Chest press 10# 2x6x3s  PATIENT EDUCATION:  Education details: HEP Person educated: Patient Education method: Programmer, Multimedia, Facilities Manager, Verbal cues, and Handouts Education comprehension: verbalized understanding and returned demonstration  HOME EXERCISE PROGRAM: 5x/week, 2x/day 2 soup can seated shrug  x6x3s Black tb standing row x6x3s Seated chin tuck x8x3s Seated chin tuck x4x3s  Do these as a warmup before working 2x/day (before work and after break) *cut reps in half*   ASSESSMENT:  CLINICAL IMPRESSION: Patient tolerated treatment with no significant increases in pain with progressions in BL upper back, and periscapular loading. 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 Current deficits include: excessive pain, activity tolerance, functional UE strength. As a result, patient would continue to benefit from skilled PT to address said deficits via plan below.     EVAL: Patient is a 63 y.o. male who was seen today for physical therapy evaluation and treatment for cervicalgia of right side. Current deficits include: Unexplained pain during arm ROM that affects working, general BL UE weakness, and general decreased activity tolerance due to being out of work. As a result, patient would benefit from skilled PT to address aforementioned deficits via plan below.  OBJECTIVE IMPAIRMENTS: decreased strength and pain.     PARTICIPATION LIMITATIONS: occupation   REHAB POTENTIAL: Good  CLINICAL DECISION MAKING: Stable/uncomplicated  EVALUATION COMPLEXITY: Low   GOALS: Goals reviewed with patient? No  SHORT TERM GOALS: Target date: 09/21/2024    Patient will decrease worst pain occurrence to 50% of original frequency to improve ADL completion and overall QOL  Baseline: 100% Goal status: INITIAL  2.  Patient will demonstrate 75% HEP compliance to show independence with self-management of condition  Baseline: 0% Goal status: INITIAL    LONG TERM GOALS: Target date: 10/31/24  Patient will demonstrate a 5 point improvement in NDI to show improvements in ADL completion and overall QOL   Baseline: 5 Goal status: INITIAL  2.  Patient will  demonstrate 100% HEP compliance to show independence with self-management of condition  Baseline: 0% Goal status: INITIAL  3.  Patient will be able to work at least 95% capacity to demonstrate ability to return to work  Baseline: 85% Goal status: INITIAL  4. Patient will decrease worst pain to 0/10 at most to improve ADL completion and overall QOL  Baseline: 1/10  Goal status: initial      PLAN:  PT FREQUENCY: 1-2x/week  PT DURATION: 8 weeks  PLANNED INTERVENTIONS: 97110-Therapeutic exercises, 97530- Therapeutic activity, 97112- Neuromuscular re-education, 97535- Self Care, 02859- Manual therapy, and Patient/Family education  PLAN FOR NEXT SESSION: HEP assessment and progression, symptom modulation, and loading (isolated and/or functional). Manual therapy and NME training as needed. Primarily focusing on building MT strength/activity tolerance    [place] Odessia Scot  PT, DPT       "

## 2024-10-11 ENCOUNTER — Emergency Department (HOSPITAL_COMMUNITY)
Admission: EM | Admit: 2024-10-11 | Discharge: 2024-10-11 | Disposition: A | Discharge status: Home / Self Care | Attending: Emergency Medicine | Admitting: Emergency Medicine

## 2024-10-11 ENCOUNTER — Encounter (HOSPITAL_COMMUNITY): Payer: Self-pay | Admitting: *Deleted

## 2024-10-11 ENCOUNTER — Other Ambulatory Visit: Payer: Self-pay

## 2024-10-11 ENCOUNTER — Emergency Department (HOSPITAL_COMMUNITY)

## 2024-10-11 DIAGNOSIS — Z7982 Long term (current) use of aspirin: Secondary | ICD-10-CM | POA: Insufficient documentation

## 2024-10-11 DIAGNOSIS — Z79899 Other long term (current) drug therapy: Secondary | ICD-10-CM | POA: Insufficient documentation

## 2024-10-11 DIAGNOSIS — E119 Type 2 diabetes mellitus without complications: Secondary | ICD-10-CM | POA: Insufficient documentation

## 2024-10-11 DIAGNOSIS — S43401A Unspecified sprain of right shoulder joint, initial encounter: Secondary | ICD-10-CM | POA: Insufficient documentation

## 2024-10-11 DIAGNOSIS — I10 Essential (primary) hypertension: Secondary | ICD-10-CM | POA: Insufficient documentation

## 2024-10-11 MED ORDER — CYCLOBENZAPRINE HCL 10 MG PO TABS: 10.0000 mg | ORAL_TABLET | Freq: Two times a day (BID) | ORAL | 0 refills | Status: AC | PRN

## 2024-10-11 MED ORDER — KETOROLAC TROMETHAMINE 15 MG/ML IJ SOLN
30.0000 mg | Freq: Once | INTRAMUSCULAR | Status: AC
  Administered 2024-10-11: 30 mg via INTRAMUSCULAR
  Filled 2024-10-11: qty 2

## 2024-10-11 MED ORDER — ACETAMINOPHEN 325 MG PO TABS
ORAL_TABLET | ORAL | Status: AC
  Filled 2024-10-11: qty 2

## 2024-10-11 MED ORDER — ACETAMINOPHEN 325 MG PO TABS
650.0000 mg | ORAL_TABLET | Freq: Once | ORAL | Status: AC
  Administered 2024-10-11: 650 mg via ORAL

## 2024-10-11 MED ORDER — NAPROXEN 500 MG PO TABS: 500.0000 mg | ORAL_TABLET | Freq: Two times a day (BID) | ORAL | 0 refills | Status: AC
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
## 2024-10-11 NOTE — Discharge Instructions (Signed)
 X-rays today look okay but concerned that you have injured your rotator cuff.  You can take the anti-inflammatories and muscle relaxers as needed you can also use Voltaren  gel and rub it on your shoulder for pain control.  You can use the sling but make sure you are trying to range your arm to avoid frozen shoulder.
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
## 2024-10-13 ENCOUNTER — Ambulatory Visit
